# Patient Record
Sex: Female | Born: 1985
Health system: Southern US, Community
[De-identification: ages and names within clinical notes are randomized; demographics above are authoritative.]

## PROBLEM LIST (undated history)

## (undated) DIAGNOSIS — Z9889 Other specified postprocedural states: Secondary | ICD-10-CM

## (undated) DIAGNOSIS — R112 Nausea with vomiting, unspecified: Secondary | ICD-10-CM

## (undated) DIAGNOSIS — F988 Other specified behavioral and emotional disorders with onset usually occurring in childhood and adolescence: Secondary | ICD-10-CM

## (undated) DIAGNOSIS — F99 Mental disorder, not otherwise specified: Secondary | ICD-10-CM

## (undated) HISTORY — DX: Nausea with vomiting, unspecified: R11.2

## (undated) HISTORY — DX: Other specified behavioral and emotional disorders with onset usually occurring in childhood and adolescence: F98.8

## (undated) HISTORY — DX: Other specified postprocedural states: Z98.890

## (undated) HISTORY — PX: TONSILLECTOMY: SHX5217

## (undated) HISTORY — PX: WISDOM TOOTH EXTRACTION: SHX21

---

## 2009-05-31 ENCOUNTER — Ambulatory Visit: Payer: Self-pay | Admitting: Family Medicine

## 2009-05-31 DIAGNOSIS — F988 Other specified behavioral and emotional disorders with onset usually occurring in childhood and adolescence: Secondary | ICD-10-CM | POA: Insufficient documentation

## 2009-08-27 ENCOUNTER — Telehealth: Payer: Self-pay | Admitting: Family Medicine

## 2009-11-21 ENCOUNTER — Telehealth: Payer: Self-pay | Admitting: Family Medicine

## 2010-02-28 ENCOUNTER — Ambulatory Visit: Payer: Self-pay | Admitting: Family Medicine

## 2010-05-16 ENCOUNTER — Telehealth: Payer: Self-pay | Admitting: Family Medicine

## 2010-08-26 ENCOUNTER — Telehealth: Payer: Self-pay | Admitting: Family Medicine

## 2010-09-23 NOTE — Progress Notes (Signed)
Summary: REFILL REQ ON Adderall  Phone Note Call from Patient   Caller: Patient @ [phone] Call For: Evelena Peat MD Reason for Call: Refill Medication Summary of Call: Pt called to req a refill on med (Adderall Xr 30 Mg).... Pt adv that she can be reached at [phone] when same is ready for p/u.   Initial call taken by: Debbra Riding,  August 27, 2009 11:00 AM  Follow-up for Phone Call        will refill. Follow-up by: Evelena Peat MD,  August 28, 2009 12:46 PM  Additional Follow-up for Phone Call Additional follow up Details #1::        Pt informed Rx ready for pick-up on personally identified VM Additional Follow-up by: Sid Falcon LPN,  August 28, 2009 1:36 PM    Prescriptions: ADDERALL XR 30 MG XR24H-CAP (AMPHETAMINE-DEXTROAMPHETAMINE) one by mouth daily  #90 x 0   Entered and Authorized by:   Evelena Peat MD   Signed by:   Evelena Peat MD on 08/28/2009   Method used:   Print then Give to Patient   RxID:   [id number]

## 2010-09-23 NOTE — Progress Notes (Signed)
Summary: new Adderall Rx needed (dental appt 10/3)  Phone Note Call from Patient Call back at Home Phone [phone]   Caller: Patient Call For: Evelena Peat MD Summary of Call: pt needs new rx adderall xr 30 mg Initial call taken by: Heron Sabins,  May 16, 2010 12:21 PM  Follow-up for Phone Call        should be due Oct 8 Follow-up by: Evelena Peat MD,  May 16, 2010 2:03 PM  Additional Follow-up for Phone Call Additional follow up Details #1::        Per Dr Caryl Never, OK to provide refill RX when she is home from college on Monday, Oct 3rd for a dental appt.  Pt informed, she will call that day a reminder. Additional Follow-up by: Sid Falcon LPN,  May 16, 2010 5:41 PM    Additional Follow-up for Phone Call Additional follow up Details #2::    OK to fill then Evelena Peat MD  May 22, 2010 1:09 PM   Additional Follow-up for Phone Call Additional follow up Details #3:: Details for Additional Follow-up Action Taken: Printed Rx and called [facility], lft msg that rx is ready for pick-up  Additional Follow-up by: Kathrynn Speed CMA,  May 26, 2010 9:28 AM  Prescriptions: ADDERALL XR 30 MG XR24H-CAP (AMPHETAMINE-DEXTROAMPHETAMINE) one by mouth daily  #90 x 0   Entered by:   Kathrynn Speed CMA   Authorized by:   Evelena Peat MD   Signed by:   Kathrynn Speed CMA on 05/26/2010   Method used:   Print then Give to Patient   RxID:   [id number]

## 2010-09-23 NOTE — Assessment & Plan Note (Signed)
Summary: MEDICATION REVIEW // RS   Vital Signs:  Patient profile:   25 year old female Weight:      113 pounds Temp:     98.3 degrees F oral BP sitting:   94 / 68  (left arm) Cuff size:   regular  Vitals Entered By: Duard Brady LPN (February 28, 1609 9:04 AM) CC: medication refill Is Patient Diabetic? No   History of Present Illness: Patient here to followup ADD. She attends [address] and will be a senior this year. School is going well. No side effects from medication. Minimal reduction in appetite but she has gained 2 pounds over the past year. No headaches.  Preventive Screening-Counseling & Management  Alcohol-Tobacco     Smoking Status: never  Allergies: 1)  ! Septra Ds (Sulfamethoxazole-Trimethoprim)  Past History:  Past Medical History: Last updated: 05/31/2009 ADD  Review of Systems  The patient denies anorexia, fever, weight loss, and chest pain.    Physical Exam  General:  Well-developed,well-nourished,in no acute distress; alert,appropriate and cooperative throughout examination Head:  Normocephalic and atraumatic without obvious abnormalities. No apparent alopecia or balding. Eyes:  No corneal or conjunctival inflammation noted. EOMI. Perrla. Funduscopic exam benign, without hemorrhages, exudates or papilledema. Vision grossly normal. Mouth:  Oral mucosa and oropharynx without lesions or exudates.  Teeth in good repair. Neck:  No deformities, masses, or tenderness noted. Lungs:  Normal respiratory effort, chest expands symmetrically. Lungs are clear to auscultation, no crackles or wheezes. Heart:  Normal rate and regular rhythm. S1 and S2 normal without gallop, murmur, click, rub or other extra sounds.   Impression & Recommendations:  Problem # 1:  ADD (ICD-314.00)  Complete Medication List: 1)  Yaz 3-0.02 Mg Tabs (Drospirenone-ethinyl estradiol) .... One by mouth daily 2)  Adderall Xr 30 Mg Xr24h-cap  (Amphetamine-dextroamphetamine) .... One by mouth daily  Patient Instructions: 1)  Please schedule a follow-up appointment in 1 year.  Prescriptions: ADDERALL XR 30 MG XR24H-CAP (AMPHETAMINE-DEXTROAMPHETAMINE) one by mouth daily  #90 x 0   Entered and Authorized by:   Evelena Peat MD   Signed by:   Evelena Peat MD on 02/28/2010   Method used:   Print then Give to Patient   RxID:   [id number]

## 2010-09-23 NOTE — Progress Notes (Signed)
Summary: refill Adderall, last #90 refilled on 08/28/09  Phone Note Refill Request Call back at Home Phone [phone] Message from:  Patient---live call  Refills Requested: Medication #1:  ADDERALL XR 30 MG XR24H-CAP one by mouth daily. call pt when ready.  Initial call taken by: Warnell Forester,  November 21, 2009 3:25 PM  Follow-up for Phone Call        Last filled 08/28/2009 Sid Falcon LPN  November 21, 2009 4:22 PM   Additional Follow-up for Phone Call Additional follow up Details #1::        pt cb checking on status Additional Follow-up by: Heron Sabins,  November 25, 2009 1:36 PM    Additional Follow-up for Phone Call Additional follow up Details #2::    refilled Follow-up by: Evelena Peat MD,  November 26, 2009 8:28 AM  Additional Follow-up for Phone Call Additional follow up Details #3:: Details for Additional Follow-up Action Taken: Pt informed on personally identified VM rx ready for pick-up Additional Follow-up by: Sid Falcon LPN,  November 26, 2009 8:34 AM  Prescriptions: ADDERALL XR 30 MG XR24H-CAP (AMPHETAMINE-DEXTROAMPHETAMINE) one by mouth daily  #90 x 0   Entered and Authorized by:   Evelena Peat MD   Signed by:   Evelena Peat MD on 11/26/2009   Method used:   Print then Give to Patient   RxID:   [id number]

## 2010-09-25 NOTE — Progress Notes (Signed)
Summary: refill Adderall, last filled 05/26/2010  Phone Note Refill Request Call back at Home Phone [phone] Message from:  Patient---live call  Refills Requested: Medication #1:  ADDERALL XR 30 MG XR24H-CAP one by mouth daily.   Brand Name Necessary? No please call when ready....dad will pick up rx.  Initial call taken by: Warnell Forester,  August 26, 2010 8:29 AM  Follow-up for Phone Call        Last filled 05-26-2010 Sid Falcon LPN  August 26, 2010 1:40 PM refilled Follow-up by: Evelena Peat MD,  August 27, 2010 8:08 AM  Additional Follow-up for Phone Call Additional follow up Details #1::        Pt informed Additional Follow-up by: Sid Falcon LPN,  August 27, 2010 10:13 AM    Prescriptions: ADDERALL XR 30 MG XR24H-CAP (AMPHETAMINE-DEXTROAMPHETAMINE) one by mouth daily  #90 x 0   Entered and Authorized by:   Evelena Peat MD   Signed by:   Evelena Peat MD on 08/27/2010   Method used:   Print then Give to Patient   RxID:   [id number]

## 2010-11-26 ENCOUNTER — Telehealth: Payer: Self-pay | Admitting: Family Medicine

## 2010-11-26 NOTE — Telephone Encounter (Signed)
Ok to refill 

## 2010-11-26 NOTE — Telephone Encounter (Signed)
Pt needs new rx generic adderall 30 mg. Pt is out of med today.

## 2010-11-27 ENCOUNTER — Encounter: Payer: Self-pay | Admitting: Family Medicine

## 2010-11-27 MED ORDER — AMPHETAMINE-DEXTROAMPHET ER 30 MG PO CP24: 30.0000 mg | ORAL_CAPSULE | ORAL | Status: DC

## 2010-11-27 NOTE — Telephone Encounter (Signed)
Rx filled for 3 months, pt informed ready for pick-up

## 2011-02-26 ENCOUNTER — Telehealth: Payer: Self-pay | Admitting: *Deleted

## 2011-02-26 NOTE — Telephone Encounter (Signed)
Pt requesting cpx appt on 03/09/11 at 8am (lives in [place] and that is the only day she can come in). I advised pt this appt time is not available, offered her another time that day however, that will not work.  Pt states the only other day she can come in is tomorrow late afternoon.  Would like to be put in at 4:15 for cpx.  Please advise if ok.  Pt has not had any labs drawn.

## 2011-02-26 NOTE — Telephone Encounter (Signed)
This is OK

## 2011-02-27 ENCOUNTER — Ambulatory Visit (INDEPENDENT_AMBULATORY_CARE_PROVIDER_SITE_OTHER): Payer: BC Managed Care – PPO | Admitting: Family Medicine

## 2011-02-27 ENCOUNTER — Encounter: Payer: Self-pay | Admitting: Family Medicine

## 2011-02-27 VITALS — BP 110/78 | HR 80 | Temp 98.6°F | Resp 12 | Ht 66.0 in | Wt 109.0 lb

## 2011-02-27 DIAGNOSIS — Z0000 Encounter for general adult medical examination without abnormal findings: Secondary | ICD-10-CM

## 2011-02-27 MED ORDER — AMPHETAMINE-DEXTROAMPHET ER 30 MG PO CP24: 30.0000 mg | ORAL_CAPSULE | ORAL | Status: DC

## 2011-02-27 NOTE — Progress Notes (Signed)
  Subjective:    Patient ID: Linda Holden, female    DOB: August 24, 1986, 25 y.o.   MRN: [id number]  HPI Patient seen for her wellness visit. She sees gynecologist regularly for Pap smears. She has history of attention deficit disorder on Adderall 30 mg daily. Also takes birth control pills. Immunizations up to date. She recalls hemoglobin through gynecologist which was normal. Patient does not do any regular exercise. No consistent calcium or vitamin D use. Probable low dietary consumption of calcium  No regular consumption of alcohol and nonsmoker  Past Medical History  Diagnosis Date  . ADD 05/31/2009   Past Surgical History  Procedure Date  . Tonsillectomy     reports that she has never smoked. She does not have any smokeless tobacco history on file. Her alcohol and drug histories not on file. family history is not on file. Allergies  Allergen Reactions  . Sulfamethoxazole W/Trimethoprim     Mother allergic severe, was told to not let her children have sulfa     Review of Systems  Constitutional: Negative for fever, activity change, appetite change and fatigue.  HENT: Negative for hearing loss, ear pain, sore throat and trouble swallowing.   Eyes: Negative for visual disturbance.  Respiratory: Negative for cough and shortness of breath.   Cardiovascular: Negative for chest pain and palpitations.  Gastrointestinal: Negative for abdominal pain, diarrhea, constipation and blood in stool.  Genitourinary: Negative for dysuria and hematuria.  Musculoskeletal: Negative for myalgias, back pain and arthralgias.  Skin: Negative for rash.  Neurological: Negative for dizziness, syncope and headaches.  Hematological: Negative for adenopathy.  Psychiatric/Behavioral: Negative for confusion and dysphoric mood.       Objective:   Physical Exam  Constitutional: She is oriented to person, place, and time. She appears well-developed and well-nourished.  HENT:  Head: Normocephalic and  atraumatic.  Eyes: EOM are normal. Pupils are equal, round, and reactive to light.  Neck: Normal range of motion. Neck supple. No thyromegaly present.  Cardiovascular: Normal rate, regular rhythm and normal heart sounds.   No murmur heard. Pulmonary/Chest: Breath sounds normal. No respiratory distress. She has no wheezes. She has no rales.  Abdominal: Soft. Bowel sounds are normal. She exhibits no distension and no mass. There is no tenderness. There is no rebound and no guarding.  Musculoskeletal: Normal range of motion. She exhibits no edema.  Lymphadenopathy:    She has no cervical adenopathy.  Neurological: She is alert and oriented to person, place, and time. She displays normal reflexes. No cranial nerve deficit.  Skin: No rash noted.  Psychiatric: She has a normal mood and affect. Her behavior is normal. Judgment and thought content normal.          Assessment & Plan:  Healthy 25 year old female. Suggested some regular strength training and aerobic exercise. Suggestions given for healthy weight gain. Continue GYN followup. Adderall refilled. No labs since hemoglobin reportedly normal recently and no indication for additional labs

## 2011-06-08 ENCOUNTER — Other Ambulatory Visit: Payer: Self-pay | Admitting: Family Medicine

## 2011-06-08 NOTE — Telephone Encounter (Signed)
adderall last filled in 02-27-11 X 3

## 2011-06-08 NOTE — Telephone Encounter (Signed)
OK to refill

## 2011-06-08 NOTE — Telephone Encounter (Signed)
Pt called req refill of amphetamine-dextroamphetamine (ADDERALL XR) 30 MG 24 hr capsule x 3 scripts. Pt req to have her father pick up script on Tuesday afternoon or on Wednesday a.m this wk.

## 2011-06-09 MED ORDER — AMPHETAMINE-DEXTROAMPHET ER 30 MG PO CP24: 30.0000 mg | ORAL_CAPSULE | ORAL | Status: DC

## 2011-06-09 NOTE — Telephone Encounter (Signed)
Pt is aware waiting on MD sig

## 2011-06-09 NOTE — Telephone Encounter (Signed)
Pt aware Rx will be ready tomorrow

## 2011-09-14 ENCOUNTER — Telehealth: Payer: Self-pay | Admitting: Family Medicine

## 2011-09-14 MED ORDER — AMPHETAMINE-DEXTROAMPHET ER 30 MG PO CP24: 30.0000 mg | ORAL_CAPSULE | ORAL | Status: DC

## 2011-09-14 NOTE — Telephone Encounter (Signed)
Message left on VM at home Rx ready to pick-up

## 2011-09-14 NOTE — Telephone Encounter (Signed)
OK to refill

## 2011-09-14 NOTE — Telephone Encounter (Signed)
Pt need new rx generic adderall xr 30 mg. Pt is out of med. Pt father Heron Nay will pick up rx.

## 2011-09-15 ENCOUNTER — Other Ambulatory Visit: Payer: Self-pay | Admitting: *Deleted

## 2011-09-15 MED ORDER — AMPHETAMINE-DEXTROAMPHET ER 30 MG PO CP24: 30.0000 mg | ORAL_CAPSULE | ORAL | Status: DC

## 2011-09-15 NOTE — Telephone Encounter (Signed)
OK per Dr Caryl Never, redo med RX

## 2011-12-10 ENCOUNTER — Telehealth: Payer: Self-pay | Admitting: Family Medicine

## 2011-12-10 NOTE — Telephone Encounter (Signed)
Pt called req to get 90 day scripts for amphetamine-dextroamphetamine (ADDERALL XR, 30MG ,) 30 MG 24 hr capsule

## 2011-12-10 NOTE — Telephone Encounter (Signed)
Last filled 09-15-11, pt requesting #90 with 0 refills Pt had CPX last July

## 2011-12-11 MED ORDER — AMPHETAMINE-DEXTROAMPHET ER 30 MG PO CP24: 30.0000 mg | ORAL_CAPSULE | ORAL | Status: DC

## 2011-12-11 NOTE — Telephone Encounter (Signed)
Refill #90 and needs f/u by July.

## 2011-12-11 NOTE — Telephone Encounter (Signed)
Rx ready for pick up, pt informed.

## 2012-02-06 ENCOUNTER — Emergency Department (HOSPITAL_COMMUNITY)
Admission: EM | Admit: 2012-02-06 | Discharge: 2012-02-06 | Disposition: A | Discharge status: Home / Self Care | Payer: BC Managed Care – PPO | Source: Home / Self Care

## 2012-09-16 ENCOUNTER — Encounter: Payer: Self-pay | Admitting: Family Medicine

## 2012-09-16 ENCOUNTER — Ambulatory Visit (INDEPENDENT_AMBULATORY_CARE_PROVIDER_SITE_OTHER): Payer: BC Managed Care – PPO | Admitting: Family Medicine

## 2012-09-16 VITALS — BP 110/70 | Wt 115.0 lb

## 2012-09-16 DIAGNOSIS — F988 Other specified behavioral and emotional disorders with onset usually occurring in childhood and adolescence: Secondary | ICD-10-CM

## 2012-09-16 MED ORDER — AMPHETAMINE-DEXTROAMPHET ER 30 MG PO CP24: 30.0000 mg | ORAL_CAPSULE | ORAL | Status: DC

## 2012-09-16 NOTE — Progress Notes (Signed)
  Subjective:    Patient ID: Linda Holden, female    DOB: 08-03-1986, 27 y.o.   MRN: [id number]  HPI Followup ADD. Patient had been seeing a physician in [place] but plans to transfer back here. She is having difficulty getting appointments there. She remains on Adderall XR 30 mg once daily. No headaches. Good appetite. 6 pound weight gain since last visit. Generally feels well. In consistent exercise. Medication seems to be working well. No difficulties with focusing   Review of Systems  Constitutional: Negative for appetite change and unexpected weight change.  Respiratory: Negative for shortness of breath.   Cardiovascular: Negative for chest pain.  Neurological: Negative for headaches.       Objective:   Physical Exam  Constitutional: She appears well-developed and well-nourished.  Neck: Neck supple. No thyromegaly present.  Cardiovascular: Normal rate and regular rhythm.  Exam reveals no gallop.   No murmur heard. Pulmonary/Chest: Effort normal and breath sounds normal. No respiratory distress. She has no wheezes. She has no rales.  Musculoskeletal: She exhibits no edema.          Assessment & Plan:  ADD. Stable. Refill medication for 3 months. We emphasized the importance of having one primary physician in charge of controlled medications such as this and she agrees. She'll send for old records

## 2012-12-13 ENCOUNTER — Telehealth: Payer: Self-pay | Admitting: Family Medicine

## 2012-12-13 MED ORDER — AMPHETAMINE-DEXTROAMPHET ER 30 MG PO CP24: 30.0000 mg | ORAL_CAPSULE | ORAL | Status: DC

## 2012-12-13 NOTE — Telephone Encounter (Signed)
Patient called stating that she need a refill of her adderall 30 mg 1poqd. Please assist.  °

## 2012-12-13 NOTE — Telephone Encounter (Signed)
Refill OK

## 2012-12-13 NOTE — Telephone Encounter (Signed)
Pt informed Rx ready to pick up

## 2012-12-13 NOTE — Telephone Encounter (Signed)
Adderall XR 30 last filled 09-16-12, #90 with 0 refills

## 2013-03-13 ENCOUNTER — Telehealth: Payer: Self-pay | Admitting: Family Medicine

## 2013-03-13 MED ORDER — AMPHETAMINE-DEXTROAMPHET ER 30 MG PO CP24: 30.0000 mg | ORAL_CAPSULE | ORAL | Status: DC

## 2013-03-13 NOTE — Telephone Encounter (Signed)
Last refill  12/13/12 #90 no refills

## 2013-03-13 NOTE — Telephone Encounter (Signed)
Refill OK

## 2013-03-13 NOTE — Telephone Encounter (Signed)
Pt calling to request rx refill of amphetamine-dextroamphetamine (ADDERALL XR) 30 MG 24 hr capsule.  Please call when available for pick up.

## 2013-03-13 NOTE — Telephone Encounter (Signed)
medication at the front for pick up

## 2013-06-08 ENCOUNTER — Telehealth: Payer: Self-pay | Admitting: Family Medicine

## 2013-06-08 MED ORDER — AMPHETAMINE-DEXTROAMPHET ER 30 MG PO CP24: 30.0000 mg | ORAL_CAPSULE | ORAL | Status: DC

## 2013-06-08 NOTE — Telephone Encounter (Signed)
Last visit 09/16/12 Last refill 11/27/10 #30 2 refill

## 2013-06-08 NOTE — Telephone Encounter (Signed)
Refill for 3 months. 

## 2013-06-08 NOTE — Telephone Encounter (Signed)
Pt is calling to request a 3 month supply of her amphetamine-dextroamphetamine (ADDERALL XR) 30 MG 24 hr capsule. Please assist.  °

## 2013-06-08 NOTE — Telephone Encounter (Signed)
Pt is aware that RX is ready for pick up. 

## 2013-08-18 ENCOUNTER — Encounter: Payer: Self-pay | Admitting: Family Medicine

## 2013-08-18 ENCOUNTER — Ambulatory Visit (INDEPENDENT_AMBULATORY_CARE_PROVIDER_SITE_OTHER): Payer: 59 | Admitting: Family Medicine

## 2013-08-18 VITALS — BP 100/60 | HR 60 | Temp 97.9°F | Wt 116.0 lb

## 2013-08-18 DIAGNOSIS — Z79899 Other long term (current) drug therapy: Secondary | ICD-10-CM

## 2013-08-18 DIAGNOSIS — F988 Other specified behavioral and emotional disorders with onset usually occurring in childhood and adolescence: Secondary | ICD-10-CM

## 2013-08-18 LAB — HEPATIC FUNCTION PANEL
Albumin: 4.3 g/dL (ref 3.5–5.2)
Bilirubin, Direct: 0.1 mg/dL (ref 0.0–0.3)
Total Protein: 6.6 g/dL (ref 6.0–8.3)

## 2013-08-18 MED ORDER — TERBINAFINE HCL 250 MG PO TABS: 250.0000 mg | ORAL_TABLET | Freq: Every day | ORAL | Status: DC

## 2013-08-18 NOTE — Patient Instructions (Signed)

## 2013-08-18 NOTE — Progress Notes (Signed)
Pre visit review using our clinic review tool, if applicable. No additional management support is needed unless otherwise documented below in the visit note. 

## 2013-08-18 NOTE — Progress Notes (Signed)
   Subjective:    Patient ID: Linda Holden, female    DOB: 1985/11/21, 27 y.o.   MRN: [id number]  HPI Patient seen for the following issues  Attention deficit disorder. She takes Adderall XR 30 mg once daily. Weight is been stable. Medications working well. No side effects. She has no specific complaints regarding this. Does not need refills of this time.  She has new issue of dysmorphic left great toenail. She describes the nail trauma several months ago. Since then she's had brittle nail changes and fungal changes. No other nails are involved. She is requesting treatment. No associated pain. No history of diabetes or circulatory problems.  Past Medical History  Diagnosis Date  . ADD 05/31/2009   Past Surgical History  Procedure Laterality Date  . Tonsillectomy      reports that she has never smoked. She does not have any smokeless tobacco history on file. Her alcohol and drug histories are not on file. family history is not on file. Allergies  Allergen Reactions  . Sulfamethoxazole-Trimethoprim     Mother allergic severe, was told to not let her children have sulfa      Review of Systems  Constitutional: Negative for appetite change, fatigue and unexpected weight change.  Eyes: Negative for visual disturbance.  Respiratory: Negative for cough, chest tightness, shortness of breath and wheezing.   Cardiovascular: Negative for chest pain, palpitations and leg swelling.  Neurological: Negative for dizziness, seizures, syncope, weakness, light-headedness and headaches.       Objective:   Physical Exam  Constitutional: She appears well-developed and well-nourished.  Neck: Neck supple. No thyromegaly present.  Cardiovascular: Normal rate.   No murmur heard. Pulmonary/Chest: Effort normal and breath sounds normal. No respiratory distress. She has no wheezes. She has no rales.  Musculoskeletal: She exhibits no edema.  Skin:  Patient has dysmorphic left great toe nail with  brittle changes.          Assessment & Plan:   #1 attention deficit disorder. Stable. Continue Adderall XR 30 mg once daily #2 probable onychomycosis left great toenail. Check hepatic panel. If normal, consider Lamisil 250 mg once daily for 3 months

## 2013-09-08 ENCOUNTER — Telehealth: Payer: Self-pay | Admitting: Family Medicine

## 2013-09-08 MED ORDER — AMPHETAMINE-DEXTROAMPHET ER 30 MG PO CP24: 30.0000 mg | ORAL_CAPSULE | ORAL | Status: DC

## 2013-09-08 NOTE — Telephone Encounter (Signed)
Last visit 08/18/13 Last refill 06/08/13 #30 2 refills

## 2013-09-08 NOTE — Telephone Encounter (Signed)
Refill OK

## 2013-09-08 NOTE — Telephone Encounter (Signed)
Pt aware that RX is ready for pick up  

## 2013-09-08 NOTE — Telephone Encounter (Signed)
Pt needs new rx generic adderall xr 30 mg °

## 2013-12-08 ENCOUNTER — Telehealth: Payer: Self-pay | Admitting: Family Medicine

## 2013-12-08 MED ORDER — AMPHETAMINE-DEXTROAMPHET ER 30 MG PO CP24: 30.0000 mg | ORAL_CAPSULE | ORAL | Status: DC

## 2013-12-08 NOTE — Telephone Encounter (Signed)
Refill OK

## 2013-12-08 NOTE — Telephone Encounter (Signed)
Pt informed and aware that RX is ready for pick up.

## 2013-12-08 NOTE — Telephone Encounter (Signed)
Pt request 3 mo refill of amphetamine-dextroamphetamine (ADDERALL XR) 30 MG 24 hr capsule

## 2013-12-08 NOTE — Telephone Encounter (Signed)
Last visit 08/18/13 Last refill 1-16/2015 #30 2 refills

## 2014-03-09 ENCOUNTER — Telehealth: Payer: Self-pay | Admitting: Family Medicine

## 2014-03-09 MED ORDER — AMPHETAMINE-DEXTROAMPHET ER 30 MG PO CP24: 30.0000 mg | ORAL_CAPSULE | ORAL | Status: DC

## 2014-03-09 NOTE — Telephone Encounter (Signed)
Left message on pt VM that RX is ready for pick up 

## 2014-03-09 NOTE — Telephone Encounter (Signed)
Last visit 08/18/13 Last refill 12/08/13 #30 2 refill

## 2014-03-09 NOTE — Telephone Encounter (Signed)
Refill OK

## 2014-03-09 NOTE — Telephone Encounter (Signed)
Pt req rx on amphetamine-dextroamphetamine (ADDERALL XR) 30 MG 24 hr capsule ° °

## 2014-04-09 ENCOUNTER — Telehealth: Payer: Self-pay | Admitting: Family Medicine

## 2014-04-09 NOTE — Telephone Encounter (Signed)
Last visit 08/18/13 Last refill 03/09/14 #30 2 refill

## 2014-04-09 NOTE — Telephone Encounter (Signed)
Pt needs new rx generic adderall xr 30 mg °

## 2014-04-09 NOTE — Telephone Encounter (Signed)
Refill times 3 then needs office follow up.

## 2014-04-10 MED ORDER — AMPHETAMINE-DEXTROAMPHET ER 30 MG PO CP24: 30.0000 mg | ORAL_CAPSULE | ORAL | Status: DC

## 2014-04-10 NOTE — Telephone Encounter (Signed)
Left detailed message on Vm. Rx is ready for pick up and needs to make follow up appt.

## 2014-05-22 ENCOUNTER — Encounter: Payer: Self-pay | Admitting: Physician Assistant

## 2014-05-22 ENCOUNTER — Ambulatory Visit (INDEPENDENT_AMBULATORY_CARE_PROVIDER_SITE_OTHER): Payer: Self-pay | Admitting: Physician Assistant

## 2014-05-22 VITALS — BP 120/80 | HR 72 | Temp 98.6°F | Resp 18 | Wt 122.3 lb

## 2014-05-22 DIAGNOSIS — N390 Urinary tract infection, site not specified: Secondary | ICD-10-CM

## 2014-05-22 DIAGNOSIS — R300 Dysuria: Secondary | ICD-10-CM

## 2014-05-22 LAB — POCT URINALYSIS DIPSTICK
Bilirubin, UA: NEGATIVE
Ketones, UA: NEGATIVE
Nitrite, UA: POSITIVE
Spec Grav, UA: <=1.005
Urobilinogen, UA: 1
pH, UA: 6

## 2014-05-22 MED ORDER — NITROFURANTOIN MONOHYD MACRO 100 MG PO CAPS: 100.0000 mg | ORAL_CAPSULE | Freq: Two times a day (BID) | ORAL | Status: DC

## 2014-05-22 NOTE — Progress Notes (Signed)
Pre visit review using our clinic review tool, if applicable. No additional management support is needed unless otherwise documented below in the visit note. 

## 2014-05-22 NOTE — Progress Notes (Signed)
Subjective:    Patient ID: Linda Holden, female    DOB: 01/08/1986, 28 y.o.   MRN: [id number]  Dysuria  This is a new problem. The current episode started in the past 7 days (saturday). The problem occurs every urination. The problem has been unchanged. The quality of the pain is described as burning. The pain is at a severity of 7/10. There is no history of pyelonephritis. Associated symptoms include frequency and urgency. Pertinent negatives include no chills, discharge, flank pain, hematuria, hesitancy, nausea, possible pregnancy, sweats or vomiting. She has tried increased fluids (tried Azo) for the symptoms. The treatment provided mild relief. There is no history of catheterization, kidney stones, recurrent UTIs, a single kidney, urinary stasis or a urological procedure.      Review of Systems  Constitutional: Negative for fever and chills.  Respiratory: Negative for shortness of breath.   Cardiovascular: Negative for chest pain.  Gastrointestinal: Negative for nausea, vomiting and diarrhea.  Genitourinary: Positive for dysuria, urgency and frequency. Negative for hesitancy, hematuria and flank pain.  Neurological: Negative for syncope and headaches.  All other systems reviewed and are negative.   Past Medical History  Diagnosis Date  . ADD 05/31/2009    History   Social History  . Marital Status: Single    Spouse Name: N/A    Number of Children: N/A  . Years of Education: N/A   Occupational History  . Not on file.   Social History Main Topics  . Smoking status: Never Smoker   . Smokeless tobacco: Not on file  . Alcohol Use: Not on file  . Drug Use: Not on file  . Sexual Activity: Not on file   Other Topics Concern  . Not on file   Social History Narrative  . No narrative on file    Past Surgical History  Procedure Laterality Date  . Tonsillectomy      No family history on file.  Allergies  Allergen Reactions  . Sulfamethoxazole-Trimethoprim    Mother allergic severe, was told to not let her children have sulfa    Current Outpatient Prescriptions on File Prior to Visit  Medication Sig Dispense Refill  . amphetamine-dextroamphetamine (ADDERALL XR) 30 MG 24 hr capsule Take 1 capsule (30 mg total) by mouth every morning.  90 capsule  0  . amphetamine-dextroamphetamine (ADDERALL XR) 30 MG 24 hr capsule Take 1 capsule (30 mg total) by mouth every morning.  30 capsule  0  . amphetamine-dextroamphetamine (ADDERALL XR) 30 MG 24 hr capsule Take 1 capsule (30 mg total) by mouth every morning.  30 capsule  0  . amphetamine-dextroamphetamine (ADDERALL XR) 30 MG 24 hr capsule Take 1 capsule (30 mg total) by mouth every morning.  30 capsule  0  . norethindrone-ethinyl estradiol (JUNEL FE,GILDESS FE,LOESTRIN FE) 1-20 MG-MCG tablet Take 1 tablet by mouth daily.      Marland Kitchen. terbinafine (LAMISIL) 250 MG tablet Take 1 tablet (250 mg total) by mouth daily.  90 tablet  0   No current facility-administered medications on file prior to visit.    EXAM: BP 120/80  Pulse 72  Temp(Src) 98.6 F (37 C) (Oral)  Resp 18  Wt 122 lb 4.8 oz (55.475 kg)  LMP 05/08/2014     Objective:   Physical Exam  Nursing note and vitals reviewed. Constitutional: She is oriented to person, place, and time. She appears well-developed and well-nourished. No distress.  HENT:  Head: Normocephalic and atraumatic.  Eyes: Conjunctivae and EOM are normal.  Pupils are equal, round, and reactive to light.  Cardiovascular: Normal rate, regular rhythm and intact distal pulses.   Pulmonary/Chest: Effort normal and breath sounds normal. No respiratory distress. She exhibits no tenderness.  No CVA ttp.  Neurological: She is alert and oriented to person, place, and time.  Skin: Skin is warm and dry. No rash noted. She is not diaphoretic. No erythema. No pallor.  Psychiatric: She has a normal mood and affect. Her behavior is normal. Judgment and thought content normal.     Lab Results    Component Value Date   ALT 10 08/18/2013   AST 16 08/18/2013        Assessment & Plan:  Jennier was seen today for dysuria.  Diagnoses and associated orders for this visit:  Dysuria Comments: UA shows blood and leuks. Obtain Culture. - POCT urinalysis dipstick; Standing - POCT urinalysis dipstick - nitrofurantoin, macrocrystal-monohydrate, (MACROBID) 100 MG capsule; Take 1 capsule (100 mg total) by mouth 2 (two) times daily. - Culture, Urine  Urinary tract infection, site not specified Comments: Treat with Macrobid pending culture results. Push fluid hydration, watchful waiting.     Return precautions provided, and patient handout on UTI.  Plan to follow up as needed, or for worsening or persistent symptoms despite treatment.  Patient Instructions  Macrobid twice daily for 7 days for UTI.  We will call with you culture results when available.  Continue to push fluid hydration with water.  If emergency symptoms discussed during visit developed, seek medical attention immediately.  Followup as needed, or for worsening or persistent symptoms despite treatment.

## 2014-05-22 NOTE — Patient Instructions (Addendum)
Macrobid twice daily for 7 days for UTI.  We will call with you culture results when available.  Continue to push fluid hydration with water.  If emergency symptoms discussed during visit developed, seek medical attention immediately.  Followup as needed, or for worsening or persistent symptoms despite treatment.    Urinary Tract Infection A urinary tract infection (UTI) can occur any place along the urinary tract. The tract includes the kidneys, ureters, bladder, and urethra. A type of germ called bacteria often causes a UTI. UTIs are often helped with antibiotic medicine.  HOME CARE   If given, take antibiotics as told by your doctor. Finish them even if you start to feel better.  Drink enough fluids to keep your pee (urine) clear or pale yellow.  Avoid tea, drinks with caffeine, and bubbly (carbonated) drinks.  Pee often. Avoid holding your pee in for a long time.  Pee before and after having sex (intercourse).  Wipe from front to back after you poop (bowel movement) if you are a woman. Use each tissue only once. GET HELP RIGHT AWAY IF:   You have back pain.  You have lower belly (abdominal) pain.  You have chills.  You feel sick to your stomach (nauseous).  You throw up (vomit).  Your burning or discomfort with peeing does not go away.  You have a fever.  Your symptoms are not better in 3 days. MAKE SURE YOU:   Understand these instructions.  Will watch your condition.  Will get help right away if you are not doing well or get worse. Document Released: 01/27/2008 Document Revised: 05/04/2012 Document Reviewed: 03/10/2012 [facility] Patient Information 2015 [employer], [place]. This information is not intended to replace advice given to you by your health care provider. Make sure you discuss any questions you have with your health care provider.

## 2014-05-25 LAB — URINE CULTURE

## 2014-07-06 ENCOUNTER — Ambulatory Visit (INDEPENDENT_AMBULATORY_CARE_PROVIDER_SITE_OTHER): Payer: BC Managed Care – PPO | Admitting: Family Medicine

## 2014-07-06 ENCOUNTER — Encounter: Payer: Self-pay | Admitting: Family Medicine

## 2014-07-06 VITALS — BP 110/70 | HR 78 | Temp 98.2°F | Wt 120.0 lb

## 2014-07-06 DIAGNOSIS — B360 Pityriasis versicolor: Secondary | ICD-10-CM

## 2014-07-06 DIAGNOSIS — B078 Other viral warts: Secondary | ICD-10-CM

## 2014-07-06 MED ORDER — KETOCONAZOLE 200 MG PO TABS: ORAL_TABLET | ORAL | Status: DC

## 2014-07-06 NOTE — Progress Notes (Signed)
Pre visit review using our clinic review tool, if applicable. No additional management support is needed unless otherwise documented below in the visit note. 

## 2014-07-06 NOTE — Progress Notes (Signed)
   Subjective:    Patient ID: Linda Holden, female    DOB: 08/04/1986, 28 y.o.   MRN: [id number]  HPI Patient seen for the following issues  Common wart right hand. She had one several years ago on one of her fingers treated with liquid nitrogen she will like the same today. This has been present for few months  Skin rash on her back. First noted last summer. More prominent with increased tanning. She has splotchy areas of hypopigmentation. No anterior trunk involvement no extremity involvement.  Past Medical History  Diagnosis Date  . ADD 05/31/2009   Past Surgical History  Procedure Laterality Date  . Tonsillectomy      reports that she has never smoked. She does not have any smokeless tobacco history on file. Her alcohol and drug histories are not on file. family history is not on file. Allergies  Allergen Reactions  . Sulfamethoxazole-Trimethoprim     Mother allergic severe, was told to not let her children have sulfa      Review of Systems  Skin: Positive for rash.       Objective:   Physical Exam  Constitutional: She appears well-developed and well-nourished.  Cardiovascular: Normal rate and regular rhythm.   Pulmonary/Chest: Effort normal.  Skin: Rash noted.  Right palm reveals proximally 5 mm common wart  Patient has splotchy hypopigmented rash on her back. No significant scaling.          Assessment & Plan:  #1 common wart right hand. Treated with liquid nitrogen. She is aware these have a high rate of recurrence #2 Tinea versicolor. Ketoconazole 200 mg 2 tablets 1 dose. Be in touch if this is not resolving over the next couple weeks. She is aware this tends to recur

## 2014-07-06 NOTE — Patient Instructions (Signed)
Tinea Versicolor Tinea versicolor is a common yeast infection of the skin. This condition becomes known when the yeast on our skin starts to overgrow (yeast is a normal inhabitant on our skin). This condition is noticed as white or light brown patches on brown skin, and is more evident in the summer on tanned skin. These areas are slightly scaly if scratched. The light patches from the yeast become evident when the yeast creates "holes in your suntan". This is most often noticed in the summer. The patches are usually located on the chest, back, pubis, neck and body folds. However, it may occur on any area of body. Mild itching and inflammation (redness or soreness) may be present. DIAGNOSIS  The diagnosisof this is made clinically (by looking). Cultures from samples are usually not needed. Examination under the microscope may help. However, yeast is normally found on skin. The diagnosis still remains clinical. Examination under Wood's Ultraviolet Light can determine the extent of the infection. TREATMENT  This common infection is usually only of cosmetic (only a concern to your appearance). It is easily treated with dandruff shampoo used during showers or bathing. Vigorous scrubbing will eliminate the yeast over several days time. The light areas in your skin may remain for weeks or months after the infection is cured unless your skin is exposed to sunlight. The lighter or darker spots caused by the fungus that remain after complete treatment are not a sign of treatment failure; it will take a long time to resolve. Your caregiver may recommend a number of commercial preparations or medication by mouth if home care is not working. Recurrence is common and preventative medication may be necessary. This skin condition is not highly contagious. Special care is not needed to protect close friends and family members. Normal hygiene is usually enough. Follow up is required only if you develop complications (such as a  secondary infection from scratching), if recommended by your caregiver, or if no relief is obtained from the preparations used. Document Released: 08/07/2000 Document Revised: 11/02/2011 Document Reviewed: 09/19/2008 ExitCare Patient Information 2015 Exit[employer], LLC. This information is not intended to replace advice given to you by your health care provider. Make sure you discuss any questions you have with your health care provider.  

## 2014-07-10 ENCOUNTER — Telehealth: Payer: Self-pay | Admitting: Family Medicine

## 2014-07-10 NOTE — Telephone Encounter (Signed)
OK to refill

## 2014-07-10 NOTE — Telephone Encounter (Signed)
Pt request refill of the following: amphetamine-dextroamphetamine (ADDERALL XR) 30 MG 24 hr capsule   Phamacy:  PICK UP

## 2014-07-10 NOTE — Telephone Encounter (Signed)
Last visit 07/06/14 Last refill 04/10/14 #30 2 refills

## 2014-07-11 MED ORDER — AMPHETAMINE-DEXTROAMPHET ER 30 MG PO CP24: 30.0000 mg | ORAL_CAPSULE | ORAL | Status: DC

## 2014-07-11 NOTE — Telephone Encounter (Signed)
Pt aware that RX is ready for pick up  

## 2014-10-08 ENCOUNTER — Telehealth: Payer: Self-pay | Admitting: Family Medicine

## 2014-10-08 MED ORDER — AMPHETAMINE-DEXTROAMPHET ER 30 MG PO CP24: 30.0000 mg | ORAL_CAPSULE | ORAL | Status: DC

## 2014-10-08 NOTE — Telephone Encounter (Signed)
Pt request refill amphetamine-dextroamphetamine (ADDERALL XR) 30 MG 24 hr capsule °3 mo supply °

## 2014-10-08 NOTE — Telephone Encounter (Signed)
Last visit 07/06/14 Last refill 07/11/14 #30 2 refill

## 2014-10-08 NOTE — Telephone Encounter (Signed)
Pt aware that Rx is ready for pick up 

## 2014-10-08 NOTE — Telephone Encounter (Signed)
Refills OK. 

## 2015-01-07 ENCOUNTER — Telehealth: Payer: Self-pay | Admitting: Family Medicine

## 2015-01-07 MED ORDER — AMPHETAMINE-DEXTROAMPHET ER 30 MG PO CP24: 30.0000 mg | ORAL_CAPSULE | ORAL | Status: DC

## 2015-01-07 NOTE — Telephone Encounter (Signed)
Needs a refill on her addrall 30mg  please. Call when ready for pick up.

## 2015-01-07 NOTE — Telephone Encounter (Signed)
Printed

## 2015-01-07 NOTE — Telephone Encounter (Signed)
Left message to advise pt Rx ready for pick up 

## 2015-01-07 NOTE — Telephone Encounter (Signed)
Refills OK. 

## 2015-01-07 NOTE — Telephone Encounter (Signed)
Ok to fill? Last ADD follow up 08/18/13

## 2015-04-05 ENCOUNTER — Telehealth: Payer: Self-pay | Admitting: Family Medicine

## 2015-04-05 MED ORDER — AMPHETAMINE-DEXTROAMPHET ER 30 MG PO CP24: 30.0000 mg | ORAL_CAPSULE | ORAL | Status: DC

## 2015-04-05 NOTE — Telephone Encounter (Signed)
Refill okay. Needs follow-up by November 

## 2015-04-05 NOTE — Telephone Encounter (Signed)
Pt aware that RX are ready for pickup

## 2015-04-05 NOTE — Telephone Encounter (Signed)
Pt needs new rx generic adderall xr 30 mg °

## 2015-04-05 NOTE — Telephone Encounter (Signed)
Last visit 07/06/14 Last refill 01/07/15 #30 2 refill

## 2015-04-22 ENCOUNTER — Encounter: Payer: Self-pay | Admitting: Family Medicine

## 2015-04-22 ENCOUNTER — Ambulatory Visit (INDEPENDENT_AMBULATORY_CARE_PROVIDER_SITE_OTHER): Payer: 59 | Admitting: Family Medicine

## 2015-04-22 VITALS — BP 102/64 | HR 77 | Temp 98.0°F | Wt 115.0 lb

## 2015-04-22 DIAGNOSIS — M2660 Temporomandibular joint disorder, unspecified: Secondary | ICD-10-CM

## 2015-04-22 DIAGNOSIS — M26609 Unspecified temporomandibular joint disorder, unspecified side: Secondary | ICD-10-CM

## 2015-04-22 NOTE — Progress Notes (Signed)
   Subjective:    Patient ID: Linda Holden, female    DOB: 27-Nov-1985, 27 y.o.   MRN: [id number]  HPI Acute visit. Patient developed some left ear pain couple weeks ago. She now has noted pain centered around her TMJ joint. No injury. No sore throat. No fevers or chills. No dizziness. Pain is worse with chewing food. She has pain with opening the mouth on the left side. No prior history of TMJ issues. She's not aware of any major malalignment issues. She had braces many years ago  Past Medical History  Diagnosis Date  . ADD 05/31/2009   Past Surgical History  Procedure Laterality Date  . Tonsillectomy      reports that she has never smoked. She does not have any smokeless tobacco history on file. Her alcohol and drug histories are not on file. family history is not on file. Allergies  Allergen Reactions  . Sulfamethoxazole-Trimethoprim     Mother allergic severe, was told to not let her children have sulfa      Review of Systems  Constitutional: Negative for fever and chills.  HENT: Negative for congestion, ear discharge, sore throat and trouble swallowing.   Skin: Negative for rash.  Hematological: Negative for adenopathy.       Objective:   Physical Exam  Constitutional: She appears well-developed and well-nourished.  HENT:  Right Ear: External ear normal.  Left Ear: External ear normal.  Mouth/Throat: Oropharynx is clear and moist.  She has tenderness over the left TMJ joint. No visible swelling or bruising. No warmth. She does have some malalignment with opening and closing the jaw at the TMJ joint  Neck: Neck supple. No thyromegaly present.  Cardiovascular: Normal rate and regular rhythm.   Pulmonary/Chest: Effort normal and breath sounds normal. No respiratory distress. She has no wheezes. She has no rales.  Lymphadenopathy:    She has no cervical adenopathy.          Assessment & Plan:  Left TMJ pain. Question cartilage subluxation. She will try icing.  Avoid difficult to chew foods. Try over-the-counter Advil or Aleve for symptom relief. Consider referral to oral maxillofacial surgeon if not improving over the next couple of weeks

## 2015-04-22 NOTE — Patient Instructions (Signed)
Temporomandibular Problems  Temporomandibular joint (TMJ) dysfunction means there are problems with the joint between your jaw and your skull. This is a joint lined by cartilage like other joints in your body but also has a small disc in the joint which keeps the bones from rubbing on each other. These joints are like other joints and can get inflamed (sore) from arthritis and other problems. When this joint gets sore, it can cause headaches and pain in the jaw and the face. CAUSES  Usually the arthritic types of problems are caused by soreness in the joint. Soreness in the joint can also be caused by overuse. This may come from grinding your teeth. It may also come from mis-alignment in the joint. DIAGNOSIS Diagnosis of this condition can often be made by history and exam. Sometimes your caregiver may need X-rays or an MRI scan to determine the exact cause. It may be necessary to see your dentist to determine if your teeth and jaws are lined up correctly. TREATMENT  Most of the time this problem is not serious; however, sometimes it can persist (become chronic). When this happens medications that will cut down on inflammation (soreness) help. Sometimes a shot of cortisone into the joint will be helpful. If your teeth are not aligned it may help for your dentist to make a splint for your mouth that can help this problem. If no physical problems can be found, the problem may come from tension. If tension is found to be the cause, biofeedback or relaxation techniques may be helpful. HOME CARE INSTRUCTIONS   Later in the day, applications of ice packs may be helpful. Ice can be used in a plastic bag with a towel around it to prevent frostbite to skin. This may be used about every 2 hours for 20 to 30 minutes, as needed while awake, or as directed by your caregiver.  Only take over-the-counter or prescription medicines for pain, discomfort, or fever as directed by your caregiver.  If physical therapy was  prescribed, follow your caregiver's directions.  Wear mouth appliances as directed if they were given. Document Released: 05/05/2001 Document Revised: 11/02/2011 Document Reviewed: 08/12/2008 ExitCare Patient Information 2015 Exit[employer], LLC. This information is not intended to replace advice given to you by your health care provider. Make sure you discuss any questions you have with your health care provider.  

## 2015-04-22 NOTE — Progress Notes (Signed)
Pre visit review using our clinic review tool, if applicable. No additional management support is needed unless otherwise documented below in the visit note. 

## 2015-07-02 ENCOUNTER — Encounter: Payer: Self-pay | Admitting: Family Medicine

## 2015-07-02 ENCOUNTER — Ambulatory Visit (INDEPENDENT_AMBULATORY_CARE_PROVIDER_SITE_OTHER): Payer: 59 | Admitting: Family Medicine

## 2015-07-02 VITALS — BP 118/72 | HR 77 | Temp 98.2°F | Resp 18 | Ht 66.0 in | Wt 118.0 lb

## 2015-07-02 DIAGNOSIS — F909 Attention-deficit hyperactivity disorder, unspecified type: Secondary | ICD-10-CM

## 2015-07-02 DIAGNOSIS — Z23 Encounter for immunization: Secondary | ICD-10-CM | POA: Diagnosis not present

## 2015-07-02 DIAGNOSIS — F988 Other specified behavioral and emotional disorders with onset usually occurring in childhood and adolescence: Secondary | ICD-10-CM

## 2015-07-02 MED ORDER — AMPHETAMINE-DEXTROAMPHET ER 30 MG PO CP24: 30.0000 mg | ORAL_CAPSULE | ORAL | Status: DC

## 2015-07-02 NOTE — Progress Notes (Signed)
   Subjective:    Patient ID: Linda Holden, female    DOB: 12/20/1985, 29 y.o.   MRN: [id number]  HPI Attention deficit disorder. Patient remains on Adderall XR 30 milligrams once daily. Denies any headaches. No insomnia. Blood pressure stable. No consistent exercise. Her weight has been stable.  She does plan to start regular exercise program soon. She sees gynecologist regularly. She declines flu vaccine. She is overdue for tetanus and does agree to that.  Past Medical History  Diagnosis Date  . ADD 05/31/2009   Past Surgical History  Procedure Laterality Date  . Tonsillectomy      reports that she has never smoked. She does not have any smokeless tobacco history on file. Her alcohol and drug histories are not on file. family history is not on file. Allergies  Allergen Reactions  . Sulfamethoxazole-Trimethoprim     Mother allergic severe, was told to not let her children have sulfa      Review of Systems  Constitutional: Negative for appetite change, fatigue and unexpected weight change.  Eyes: Negative for visual disturbance.  Respiratory: Negative for cough, chest tightness, shortness of breath and wheezing.   Cardiovascular: Negative for chest pain, palpitations and leg swelling.  Neurological: Negative for dizziness, seizures, syncope, weakness, light-headedness and headaches.       Objective:   Physical Exam  Constitutional: She appears well-developed and well-nourished.  Neck: Neck supple. No thyromegaly present.  Cardiovascular: Normal rate and regular rhythm.  Exam reveals no gallop.   No murmur heard. Pulmonary/Chest: Effort normal and breath sounds normal. No respiratory distress. She has no wheezes. She has no rales.  Musculoskeletal: She exhibits no edema.          Assessment & Plan:  ADD stable. Refill Adderall for 3 months. Flu vaccine offered and declined. Tetanus booster given

## 2015-07-02 NOTE — Addendum Note (Signed)
Addended by: Jimmye NormanPHANOS, Kourosh Jablonsky J on: 07/02/2015 10:18 AM   Modules accepted: Orders

## 2015-08-02 ENCOUNTER — Ambulatory Visit (INDEPENDENT_AMBULATORY_CARE_PROVIDER_SITE_OTHER): Payer: 59 | Admitting: Family Medicine

## 2015-08-02 ENCOUNTER — Ambulatory Visit: Payer: 59 | Admitting: Family Medicine

## 2015-08-02 ENCOUNTER — Encounter: Payer: Self-pay | Admitting: Family Medicine

## 2015-08-02 VITALS — BP 110/80 | HR 115 | Temp 98.3°F | Resp 14 | Ht 66.0 in | Wt 120.5 lb

## 2015-08-02 DIAGNOSIS — J019 Acute sinusitis, unspecified: Secondary | ICD-10-CM | POA: Diagnosis not present

## 2015-08-02 MED ORDER — AMOXICILLIN 875 MG PO TABS: 875.0000 mg | ORAL_TABLET | Freq: Two times a day (BID) | ORAL | Status: DC

## 2015-08-02 NOTE — Patient Instructions (Signed)

## 2015-08-02 NOTE — Progress Notes (Signed)
   Subjective:    Patient ID: Linda FickLindsey Pizzo, female    DOB: 12/06/1985, 29 y.o.   MRN: [id number]  HPI Acute visit.  Onset of typical cold-like symptoms about 2-3 weeks ago. Now has progressive nasal congestion and thick colored nasal discharge early mornings. Maxillary sinus pressure bilaterally.  Denies upper teeth pain. No fever. Increasing productive cough, especially at night. Denies associated fever or chills. No relief with over-the-counter medications.  Nonsmoker. Generally very healthy.  Past Medical History  Diagnosis Date  . ADD 05/31/2009   Past Surgical History  Procedure Laterality Date  . Tonsillectomy      reports that she has never smoked. She does not have any smokeless tobacco history on file. Her alcohol and drug histories are not on file. family history is not on file. Allergies  Allergen Reactions  . Sulfamethoxazole-Trimethoprim     Mother allergic severe, was told to not let her children have sulfa      Review of Systems  Constitutional: Negative for fever and chills.  HENT: Positive for congestion, sinus pressure and sore throat.   Respiratory: Positive for cough.   Neurological: Positive for headaches.       Objective:   Physical Exam  Constitutional: She appears well-developed and well-nourished.  HENT:  Right Ear: External ear normal.  Left Ear: External ear normal.  Mouth/Throat: Oropharynx is clear and moist.  Neck: Neck supple.  Cardiovascular: Normal rate and regular rhythm.   Pulmonary/Chest: Effort normal and breath sounds normal. No respiratory distress. She has no wheezes. She has no rales.  Lymphadenopathy:    She has no cervical adenopathy.          Assessment & Plan:  Bilateral maxillary sinusitis symptoms. Given duration of symptoms start amoxicillin 875 mg twice daily for 10 days. Take over-the-counter Mucinex for congestion and hydrate well. Follow-up as needed

## 2015-08-02 NOTE — Progress Notes (Signed)
Pre visit review using our clinic review tool, if applicable. No additional management support is needed unless otherwise documented below in the visit note. 

## 2015-10-01 ENCOUNTER — Telehealth: Payer: Self-pay | Admitting: Family Medicine

## 2015-10-01 NOTE — Telephone Encounter (Signed)
Patient would like her ADDERALL prescription refilled.

## 2015-10-02 MED ORDER — AMPHETAMINE-DEXTROAMPHET ER 30 MG PO CP24: 30.0000 mg | ORAL_CAPSULE | ORAL | Status: DC

## 2015-10-02 NOTE — Telephone Encounter (Signed)
Last visit to discuss ADHD was 07/02/2015 Last filled 07/02/2015 x3 Okay to refill?

## 2015-10-02 NOTE — Telephone Encounter (Signed)
Printed for signature

## 2015-10-02 NOTE — Telephone Encounter (Signed)
Pt is aware of results. 

## 2015-10-02 NOTE — Telephone Encounter (Signed)
yes

## 2016-01-03 ENCOUNTER — Telehealth: Payer: Self-pay | Admitting: Family Medicine

## 2016-01-03 DIAGNOSIS — Z79899 Other long term (current) drug therapy: Secondary | ICD-10-CM

## 2016-01-03 MED ORDER — AMPHETAMINE-DEXTROAMPHET ER 30 MG PO CP24: 30.0000 mg | ORAL_CAPSULE | ORAL | Status: DC

## 2016-01-03 MED ORDER — AMPHETAMINE-DEXTROAMPHET ER 30 MG PO CP24
30.0000 mg | ORAL_CAPSULE | ORAL | Status: DC
Start: 2016-01-03 — End: 2016-04-03

## 2016-01-03 NOTE — Telephone Encounter (Signed)
Last OV 07/29/2016 Last refill x3 10/02/2015 Please advise

## 2016-01-03 NOTE — Telephone Encounter (Signed)
Refill Adderall OK. She cannot take Lamisil without having recent hepatic panel first.

## 2016-01-03 NOTE — Telephone Encounter (Signed)
Printed for signature

## 2016-01-03 NOTE — Telephone Encounter (Signed)
Pt request refill  amphetamine-dextroamphetamine (ADDERALL XR) 30 MG 24 hr capsule 3 mo supply  Pt states her toenail fungus has returned  And would like a refill of terbinafine (LAMISIL) 250 MG tablet  Pt states Dr told her it may likely return so she hopes she does not need office visit.  [facility]

## 2016-01-06 NOTE — Telephone Encounter (Signed)
Order entered

## 2016-01-06 NOTE — Addendum Note (Signed)
Addended by: Tempie HoistMCNEIL, Hellon Vaccarella M on: 01/06/2016 12:34 PM   Modules accepted: Orders

## 2016-01-06 NOTE — Telephone Encounter (Signed)
Pt is aware rx is ready must have liver panel first before getting lamisil

## 2016-03-31 ENCOUNTER — Telehealth: Payer: Self-pay | Admitting: Family Medicine

## 2016-03-31 NOTE — Telephone Encounter (Signed)
Pt is aware that she is due on 01/03/2016 but would like to pick them up on a Friday. Okay?

## 2016-03-31 NOTE — Telephone Encounter (Signed)
° °  Refill req   amphetamine-dextroamphetamine (ADDERALL XR) 30 MG 24 hr capsule(Expired)

## 2016-03-31 NOTE — Telephone Encounter (Signed)
Left pt a message to call back. Looks like we printed 01/03/2016 x3. Please verify this with her when she calls back. Thanks.

## 2016-03-31 NOTE — Telephone Encounter (Signed)
OK 

## 2016-04-03 MED ORDER — AMPHETAMINE-DEXTROAMPHET ER 30 MG PO CP24: 30.0000 mg | ORAL_CAPSULE | ORAL | 0 refills | Status: DC

## 2016-04-03 NOTE — Telephone Encounter (Signed)
Pt is aware that scripts are up front for pick up. 

## 2016-04-03 NOTE — Addendum Note (Signed)
Addended by: Tempie HoistMCNEIL, AUTUMN M on: 04/03/2016 09:19 AM   Modules accepted: Orders

## 2016-06-30 ENCOUNTER — Telehealth: Payer: Self-pay | Admitting: Family Medicine

## 2016-06-30 NOTE — Telephone Encounter (Signed)
Pt needs generic adderall xr 30 mg

## 2016-06-30 NOTE — Telephone Encounter (Signed)
Pre visit review using our clinic review tool, if applicable. No additional management support is needed unless otherwise documented below in the visit note. 

## 2016-06-30 NOTE — Telephone Encounter (Signed)
Refill OK   Needs office follow up soon.   

## 2016-06-30 NOTE — Telephone Encounter (Signed)
Last OV 08/02/2015 Last refill 04-03-2016 X3 Please advise

## 2016-07-01 MED ORDER — AMPHETAMINE-DEXTROAMPHET ER 30 MG PO CP24: 30.0000 mg | ORAL_CAPSULE | ORAL | 0 refills | Status: DC

## 2016-07-01 NOTE — Telephone Encounter (Signed)
Pt is aware that script is up front for pick up.  

## 2016-07-28 ENCOUNTER — Ambulatory Visit (INDEPENDENT_AMBULATORY_CARE_PROVIDER_SITE_OTHER): Payer: 59 | Admitting: Family Medicine

## 2016-07-28 ENCOUNTER — Encounter: Payer: Self-pay | Admitting: Family Medicine

## 2016-07-28 VITALS — BP 108/62 | HR 87 | Temp 98.2°F | Ht 66.0 in | Wt 116.0 lb

## 2016-07-28 DIAGNOSIS — F988 Other specified behavioral and emotional disorders with onset usually occurring in childhood and adolescence: Secondary | ICD-10-CM | POA: Diagnosis not present

## 2016-07-28 MED ORDER — AMPHETAMINE-DEXTROAMPHET ER 25 MG PO CP24: ORAL_CAPSULE | ORAL | 0 refills | Status: DC

## 2016-07-28 MED ORDER — AMPHETAMINE-DEXTROAMPHET ER 25 MG PO CP24: 25.0000 mg | ORAL_CAPSULE | ORAL | 0 refills | Status: DC

## 2016-07-28 NOTE — Progress Notes (Signed)
Subjective:     Patient ID: Linda FickLindsey Loughmiller, female   DOB: 06/05/1986, 30 y.o.   MRN: [id number]  HPI Here for follow-up regarding attention deficit disorder. She's been for several years on Adderall XR 30 mg daily. She is not aware of trying smaller doses. Her weight is slightly down from last year at 116 pounds compared with 120 pounds last her. She denies any appetite suppression. No insomnia. No palpitations. No consistent exercise.  Past Medical History:  Diagnosis Date  . ADD 05/31/2009   Past Surgical History:  Procedure Laterality Date  . TONSILLECTOMY      reports that she has never smoked. She has never used smokeless tobacco. Her alcohol and drug histories are not on file. family history is not on file. Allergies  Allergen Reactions  . Sulfamethoxazole-Trimethoprim     Mother allergic severe, was told to not let her children have sulfa     Review of Systems  Constitutional: Negative for fatigue.  Eyes: Negative for visual disturbance.  Respiratory: Negative for cough, chest tightness, shortness of breath and wheezing.   Cardiovascular: Negative for chest pain, palpitations and leg swelling.  Neurological: Negative for dizziness, seizures, syncope, weakness, light-headedness and headaches.       Objective:   Physical Exam  Constitutional: She appears well-developed and well-nourished.  Eyes: Pupils are equal, round, and reactive to light.  Neck: Neck supple. No JVD present. No thyromegaly present.  Cardiovascular: Normal rate and regular rhythm.  Exam reveals no gallop.   Pulmonary/Chest: Effort normal and breath sounds normal. No respiratory distress. She has no wheezes. She has no rales.  Musculoskeletal: She exhibits no edema.  Neurological: She is alert.       Assessment:     Attention deficit disorder. Stable    Plan:     -We recommended try reducing her Adderall XR to 25 mg once daily and give some feedback in a month if not doing well this  dosage. -Monitor weight closely and be in touch if she has any further weight loss  Kristian CoveyBruce W Kaile Bixler MD [facility]

## 2016-08-20 ENCOUNTER — Encounter: Payer: Self-pay | Admitting: Family Medicine

## 2016-08-20 ENCOUNTER — Ambulatory Visit (INDEPENDENT_AMBULATORY_CARE_PROVIDER_SITE_OTHER): Payer: 59 | Admitting: Family Medicine

## 2016-08-20 VITALS — BP 116/80 | HR 102 | Temp 98.1°F | Wt 117.0 lb

## 2016-08-20 DIAGNOSIS — J029 Acute pharyngitis, unspecified: Secondary | ICD-10-CM | POA: Diagnosis not present

## 2016-08-20 DIAGNOSIS — J209 Acute bronchitis, unspecified: Secondary | ICD-10-CM | POA: Diagnosis not present

## 2016-08-20 LAB — POCT RAPID STREP A (OFFICE): Rapid Strep A Screen: NEGATIVE

## 2016-08-20 NOTE — Progress Notes (Signed)
Pre visit review using our clinic review tool, if applicable. No additional management support is needed unless otherwise documented below in the visit note. 

## 2016-08-20 NOTE — Progress Notes (Signed)
Subjective:    Patient ID: [name], female    DOB: 10/02/1985, 30 y.o.   MRN: [id number]  HPI  Linda Holden is a 30 year old female who presents today with productive cough that has been present for 2 days. Cough is productive of yellow/green sputum. Associated symptoms of fever of 100.1 F that resolved with tylenol and did not occur again, sore throat, and mild nausea without vomiting that has improved. She denies sinus pressure/pain, nasal congestion, ear pain/pressure, vomiting, diarrhea, fatigue, tooth pain, or myalgias. No recent sick contact exposure at home but notes sick relatives during Christmas gatherings. No recent antibiotic therapy. No history of asthma; reports prior episodes of bronchitis Treatment at home with cough drops and herbal tea has provided limited benefit.   Review of Systems  Constitutional: Positive for fever. Negative for chills and fatigue.  HENT: Positive for sore throat. Negative for congestion, ear pain, postnasal drip, rhinorrhea, sinus pain, sinus pressure and sneezing.   Respiratory: Positive for cough. Negative for shortness of breath and wheezing.   Cardiovascular: Negative for chest pain and palpitations.  Gastrointestinal: Positive for nausea. Negative for abdominal pain, constipation, diarrhea and vomiting.  Musculoskeletal: Negative for myalgias.  Neurological: Negative for dizziness and light-headedness.   Past Medical History:  Diagnosis Date  . ADD 05/31/2009     Social History   Social History  . Marital status: Single    Spouse name: N/A  . Number of children: N/A  . Years of education: N/A   Occupational History  . Not on file.   Social History Main Topics  . Smoking status: Never Smoker  . Smokeless tobacco: Never Used  . Alcohol use Not on file  . Drug use: Unknown  . Sexual activity: Not on file   Other Topics Concern  . Not on file   Social History Narrative  . No narrative on file    Past Surgical History:    Procedure Laterality Date  . TONSILLECTOMY      No family history on file.  Allergies  Allergen Reactions  . Sulfamethoxazole-Trimethoprim     Mother allergic severe, was told to not let her children have sulfa    Current Outpatient Prescriptions on File Prior to Visit  Medication Sig Dispense Refill  . amphetamine-dextroamphetamine (ADDERALL XR) 25 MG 24 hr capsule Take 1 capsule by mouth every morning. 30 capsule 0  . amphetamine-dextroamphetamine (ADDERALL XR) 25 MG 24 hr capsule One po daily-may refill in one month 30 capsule 0  . amphetamine-dextroamphetamine (ADDERALL XR) 25 MG 24 hr capsule One po daily- may refill in two months 30 capsule 0   No current facility-administered medications on file prior to visit.     BP 116/80 (BP Location: Left Arm, Patient Position: Sitting, Cuff Size: Normal)   Pulse (!) 102   Temp 98.1 F (36.7 C) (Oral)   Wt 117 lb (53.1 kg)   SpO2 98%   BMI 18.88 kg/m       Objective:   Physical Exam  Constitutional: She is oriented to person, place, and time. She appears well-developed and well-nourished.  HENT:  Right Ear: Tympanic membrane normal.  Left Ear: Tympanic membrane normal.  Nose: Rhinorrhea present. Right sinus exhibits no maxillary sinus tenderness and no frontal sinus tenderness. Left sinus exhibits no maxillary sinus tenderness and no frontal sinus tenderness.  Mouth/Throat: Mucous membranes are normal. Posterior oropharyngeal erythema present. No oropharyngeal exudate.  Eyes: Pupils are equal, round, and reactive to light. No  scleral icterus.  Neck: Neck supple.  Cardiovascular: Normal rate and regular rhythm.   Pulmonary/Chest: Effort normal and breath sounds normal. She has no wheezes. She has no rales.  Lymphadenopathy:    She has no cervical adenopathy.  Neurological: She is alert and oriented to person, place, and time. Coordination normal.  Skin: Skin is warm and dry. No rash noted.  Psychiatric: She has a normal  mood and affect. Her behavior is normal. Judgment and thought content normal.       Assessment & Plan:  1. Acute bronchitis, unspecified organism Advised patient on supportive measures:  Get rest, drink plenty of fluids, and use tylenol or ibuprofen as needed for pain. Follow up if fever >101, if symptoms worsen or if symptoms are not improved in 3 to 4 days. Patient verbalizes understanding.  Mucinex DM for cough.  2. Sore throat Rapid strep is negative; supportive measures as stated above. - POCT rapid strep A  Patient is currently not taking birth control and is trying to conceive; will defer benzonatate and hycodan as patient is interested in supportive measure first before adding additional medication.  Roddie McJulia Aniylah Avans, FNP-C

## 2016-08-20 NOTE — Patient Instructions (Signed)
Mucinex DM for cough works well. Advised patient on supportive measures:  Get rest, drink plenty of fluids, and use tylenol or ibuprofen as needed for pain. Follow up if fever >101, if symptoms worsen or if symptoms are not improved in 3 to 4 days. Patient verbalizes understanding.    Acute Bronchitis, Adult Acute bronchitis is when air tubes (bronchi) in the lungs suddenly get swollen. The condition can make it hard to breathe. It can also cause these symptoms:  A cough.  Coughing up clear, yellow, or green mucus.  Wheezing.  Chest congestion.  Shortness of breath.  A fever.  Body aches.  Chills.  A sore throat. Follow these instructions at home: Medicines  Take over-the-counter and prescription medicines only as told by your doctor.  If you were prescribed an antibiotic medicine, take it as told by your doctor. Do not stop taking the antibiotic even if you start to feel better. General instructions  Rest.  Drink enough fluids to keep your pee (urine) clear or pale yellow.  Avoid smoking and secondhand smoke. If you smoke and you need help quitting, ask your doctor. Quitting will help your lungs heal faster.  Use an inhaler, cool mist vaporizer, or humidifier as told by your doctor.  Keep all follow-up visits as told by your doctor. This is important. How is this prevented? To lower your risk of getting this condition again:  Wash your hands often with soap and water. If you cannot use soap and water, use hand sanitizer.  Avoid contact with people who have cold symptoms.  Try not to touch your hands to your mouth, nose, or eyes.  Make sure to get the flu shot every year. Contact a doctor if:  Your symptoms do not get better in 2 weeks. Get help right away if:  You cough up blood.  You have chest pain.  You have very bad shortness of breath.  You become dehydrated.  You faint (pass out) or keep feeling like you are going to pass out.  You keep  throwing up (vomiting).  You have a very bad headache.  Your fever or chills gets worse. This information is not intended to replace advice given to you by your health care provider. Make sure you discuss any questions you have with your health care provider. Document Released: 01/27/2008 Document Revised: 03/18/2016 Document Reviewed: 01/29/2016 Elsevier Interactive Patient Education  2017 [employer]

## 2016-10-03 DIAGNOSIS — N3001 Acute cystitis with hematuria: Secondary | ICD-10-CM | POA: Diagnosis not present

## 2016-10-29 DIAGNOSIS — Z01419 Encounter for gynecological examination (general) (routine) without abnormal findings: Secondary | ICD-10-CM | POA: Diagnosis not present

## 2017-03-29 ENCOUNTER — Other Ambulatory Visit (HOSPITAL_COMMUNITY): Payer: Self-pay | Admitting: Obstetrics and Gynecology

## 2017-03-29 DIAGNOSIS — Z3141 Encounter for fertility testing: Secondary | ICD-10-CM

## 2017-03-31 ENCOUNTER — Ambulatory Visit (HOSPITAL_COMMUNITY)
Admission: RE | Admit: 2017-03-31 | Discharge: 2017-03-31 | Disposition: A | Discharge status: Home / Self Care | Payer: 59 | Source: Ambulatory Visit | Attending: Obstetrics and Gynecology | Admitting: Obstetrics and Gynecology

## 2017-03-31 DIAGNOSIS — Z3141 Encounter for fertility testing: Secondary | ICD-10-CM | POA: Insufficient documentation

## 2017-03-31 MED ORDER — IOPAMIDOL (ISOVUE-300) INJECTION 61%
30.0000 mL | Freq: Once | INTRAVENOUS | Status: AC | PRN
  Administered 2017-03-31: 3 mL

## 2017-04-20 DIAGNOSIS — N912 Amenorrhea, unspecified: Secondary | ICD-10-CM | POA: Diagnosis not present

## 2017-04-22 DIAGNOSIS — N912 Amenorrhea, unspecified: Secondary | ICD-10-CM | POA: Diagnosis not present

## 2017-05-04 DIAGNOSIS — N911 Secondary amenorrhea: Secondary | ICD-10-CM | POA: Diagnosis not present

## 2017-05-10 DIAGNOSIS — N911 Secondary amenorrhea: Secondary | ICD-10-CM | POA: Diagnosis not present

## 2017-05-27 LAB — OB RESULTS CONSOLE HEPATITIS B SURFACE ANTIGEN: Hepatitis B Surface Ag: NEGATIVE

## 2017-05-27 LAB — OB RESULTS CONSOLE ABO/RH: RH Type: NEGATIVE

## 2017-05-27 LAB — OB RESULTS CONSOLE HIV ANTIBODY (ROUTINE TESTING): HIV: NONREACTIVE

## 2017-05-27 LAB — OB RESULTS CONSOLE GC/CHLAMYDIA
Chlamydia: NEGATIVE
GC PROBE AMP, GENITAL: NEGATIVE

## 2017-05-27 LAB — OB RESULTS CONSOLE ANTIBODY SCREEN: Antibody Screen: NEGATIVE

## 2017-05-27 LAB — OB RESULTS CONSOLE RPR: RPR: NONREACTIVE

## 2017-05-27 LAB — OB RESULTS CONSOLE RUBELLA ANTIBODY, IGM: Rubella: IMMUNE

## 2017-08-24 NOTE — L&D Delivery Note (Signed)
Delivery Note At 2:18 PM a viable female was delivered via Vaginal, Spontaneous ROA Position   APGAR: 8 and 9 weight  pending .   Placenta status:spontaneously with 3 vessel cord  , .  Cord:  with the following complications:nuchal x 1 loose .  Cord pH: not obtained  Anesthesia:  epidural Episiotomy: None Lacerations: 2nd degree Suture Repair: 2.0 chromic Est. Blood Loss (mL):  300  Mom to postpartum.  Baby to Couplet care / Skin to Skin.  Takyra Cantrall L 12/16/2017, 2:31 PM

## 2017-09-29 DIAGNOSIS — Z3A26 26 weeks gestation of pregnancy: Secondary | ICD-10-CM | POA: Diagnosis not present

## 2017-09-29 DIAGNOSIS — Z23 Encounter for immunization: Secondary | ICD-10-CM | POA: Diagnosis not present

## 2017-09-29 DIAGNOSIS — O4402 Placenta previa specified as without hemorrhage, second trimester: Secondary | ICD-10-CM | POA: Diagnosis not present

## 2017-12-14 ENCOUNTER — Telehealth (HOSPITAL_COMMUNITY): Payer: Self-pay | Admitting: *Deleted

## 2017-12-14 ENCOUNTER — Encounter (HOSPITAL_COMMUNITY): Payer: Self-pay | Admitting: *Deleted

## 2017-12-14 LAB — OB RESULTS CONSOLE GBS: STREP GROUP B AG: NEGATIVE

## 2017-12-14 NOTE — Telephone Encounter (Signed)
Preadmission screen  

## 2017-12-15 ENCOUNTER — Inpatient Hospital Stay (HOSPITAL_COMMUNITY)
Admission: AD | Admit: 2017-12-15 | Discharge: 2017-12-16 | Disposition: A | Discharge status: Home / Self Care | Payer: 59 | Source: Ambulatory Visit | Attending: Obstetrics and Gynecology | Admitting: Obstetrics and Gynecology

## 2017-12-15 ENCOUNTER — Encounter (HOSPITAL_COMMUNITY): Payer: Self-pay | Admitting: *Deleted

## 2017-12-15 DIAGNOSIS — O479 False labor, unspecified: Secondary | ICD-10-CM

## 2017-12-15 MED ORDER — ZOLPIDEM TARTRATE 5 MG PO TABS
5.0000 mg | ORAL_TABLET | Freq: Once | ORAL | Status: AC
  Administered 2017-12-16: 5 mg via ORAL
  Filled 2017-12-15: qty 1

## 2017-12-15 MED ORDER — ZOLPIDEM TARTRATE 5 MG PO TABS: 5.0000 mg | ORAL_TABLET | Freq: Once | ORAL | 0 refills | Status: DC

## 2017-12-15 NOTE — Discharge Instructions (Signed)
Braxton Hicks Contractions °Contractions of the uterus can occur throughout pregnancy, but they are not always a sign that you are in labor. You may have practice contractions called Braxton Hicks contractions. These false labor contractions are sometimes confused with true labor. °What are Braxton Hicks contractions? °Braxton Hicks contractions are tightening movements that occur in the muscles of the uterus before labor. Unlike true labor contractions, these contractions do not result in opening (dilation) and thinning of the cervix. Toward the end of pregnancy (32-34 weeks), Braxton Hicks contractions can happen more often and may become stronger. These contractions are sometimes difficult to tell apart from true labor because they can be very uncomfortable. You should not feel embarrassed if you go to the hospital with false labor. °Sometimes, the only way to tell if you are in true labor is for your health care provider to look for changes in the cervix. The health care provider will do a physical exam and may monitor your contractions. If you are not in true labor, the exam should show that your cervix is not dilating and your water has not broken. °If there are other health problems associated with your pregnancy, it is completely safe for you to be sent home with false labor. You may continue to have Braxton Hicks contractions until you go into true labor. °How to tell the difference between true labor and false labor °True labor °· Contractions last 30-70 seconds. °· Contractions become very regular. °· Discomfort is usually felt in the top of the uterus, and it spreads to the lower abdomen and low back. °· Contractions do not go away with walking. °· Contractions usually become more intense and increase in frequency. °· The cervix dilates and gets thinner. °False labor °· Contractions are usually shorter and not as strong as true labor contractions. °· Contractions are usually irregular. °· Contractions  are often felt in the front of the lower abdomen and in the groin. °· Contractions may go away when you walk around or change positions while lying down. °· Contractions get weaker and are shorter-lasting as time goes on. °· The cervix usually does not dilate or become thin. °Follow these instructions at home: °· Take over-the-counter and prescription medicines only as told by your health care provider. °· Keep up with your usual exercises and follow other instructions from your health care provider. °· Eat and drink lightly if you think you are going into labor. °· If Braxton Hicks contractions are making you uncomfortable: °? Change your position from lying down or resting to walking, or change from walking to resting. °? Sit and rest in a tub of warm water. °? Drink enough fluid to keep your urine pale yellow. Dehydration may cause these contractions. °? Do slow and deep breathing several times an hour. °· Keep all follow-up prenatal visits as told by your health care provider. This is important. °Contact a health care provider if: °· You have a fever. °· You have continuous pain in your abdomen. °Get help right away if: °· Your contractions become stronger, more regular, and closer together. °· You have fluid leaking or gushing from your vagina. °· You pass blood-tinged mucus (bloody show). °· You have bleeding from your vagina. °· You have low back pain that you never had before. °· You feel your baby’s head pushing down and causing pelvic pressure. °· Your baby is not moving inside you as much as it used to. °Summary °· Contractions that occur before labor are called Braxton   Hicks contractions, false labor, or practice contractions. °· Braxton Hicks contractions are usually shorter, weaker, farther apart, and less regular than true labor contractions. True labor contractions usually become progressively stronger and regular and they become more frequent. °· Manage discomfort from Braxton Hicks contractions by  changing position, resting in a warm bath, drinking plenty of water, or practicing deep breathing. °This information is not intended to replace advice given to you by your health care provider. Make sure you discuss any questions you have with your health care provider. °Document Released: 12/24/2016 Document Revised: 12/24/2016 Document Reviewed: 12/24/2016 °Elsevier Interactive Patient Education © 2018 [employer] Inc[employer] ° °Fetal Movement Counts °Patient Name: ________________________________________________ Patient Due Date: ____________________ °What is a fetal movement count? °A fetal movement count is the number of times that you feel your baby move during a certain amount of time. This may also be called a fetal kick count. A fetal movement count is recommended for every pregnant woman. You may be asked to start counting fetal movements as early as week 28 of your pregnancy. °Pay attention to when your baby is most active. You may notice your baby's sleep and wake cycles. You may also notice things that make your baby move more. You should do a fetal movement count: °· When your baby is normally most active. °· At the same time each day. ° °A good time to count movements is while you are resting, after having something to eat and drink. °How do I count fetal movements? °1. Find a quiet, comfortable area. Sit, or lie down on your side. °2. Write down the date, the start time and stop time, and the number of movements that you felt between those two times. Take this information with you to your health care visits. °3. For 2 hours, count kicks, flutters, swishes, rolls, and jabs. You should feel at least 10 movements during 2 hours. °4. You may stop counting after you have felt 10 movements. °5. If you do not feel 10 movements in 2 hours, have something to eat and drink. Then, keep resting and counting for 1 hour. If you feel at least 4 movements during that hour, you may stop counting. °Contact a health care  provider if: °· You feel fewer than 4 movements in 2 hours. °· Your baby is not moving like he or she usually does. °Date: ____________ Start time: ____________ Stop time: ____________ Movements: ____________ °Date: ____________ Start time: ____________ Stop time: ____________ Movements: ____________ °Date: ____________ Start time: ____________ Stop time: ____________ Movements: ____________ °Date: ____________ Start time: ____________ Stop time: ____________ Movements: ____________ °Date: ____________ Start time: ____________ Stop time: ____________ Movements: ____________ °Date: ____________ Start time: ____________ Stop time: ____________ Movements: ____________ °Date: ____________ Start time: ____________ Stop time: ____________ Movements: ____________ °Date: ____________ Start time: ____________ Stop time: ____________ Movements: ____________ °Date: ____________ Start time: ____________ Stop time: ____________ Movements: ____________ °This information is not intended to replace advice given to you by your health care provider. Make sure you discuss any questions you have with your health care provider. °Document Released: 09/09/2006 Document Revised: 04/08/2016 Document Reviewed: 09/19/2015 °Elsevier Interactive Patient Education © 2018 Elsevier Inc. ° °

## 2017-12-15 NOTE — MAU Note (Signed)
Urine sent to lab 

## 2017-12-16 ENCOUNTER — Inpatient Hospital Stay (HOSPITAL_COMMUNITY): Payer: 59 | Admitting: Anesthesiology

## 2017-12-16 ENCOUNTER — Inpatient Hospital Stay (HOSPITAL_COMMUNITY)
Admission: AD | Admit: 2017-12-16 | Discharge: 2017-12-18 | DRG: 807 | Disposition: A | Discharge status: Home / Self Care | Payer: 59 | Source: Ambulatory Visit | Attending: Obstetrics and Gynecology | Admitting: Obstetrics and Gynecology

## 2017-12-16 ENCOUNTER — Encounter (HOSPITAL_COMMUNITY): Payer: Self-pay | Admitting: *Deleted

## 2017-12-16 DIAGNOSIS — Z3483 Encounter for supervision of other normal pregnancy, third trimester: Secondary | ICD-10-CM | POA: Diagnosis not present

## 2017-12-16 DIAGNOSIS — O9902 Anemia complicating childbirth: Secondary | ICD-10-CM | POA: Diagnosis not present

## 2017-12-16 DIAGNOSIS — D649 Anemia, unspecified: Secondary | ICD-10-CM | POA: Diagnosis present

## 2017-12-16 DIAGNOSIS — Z3A37 37 weeks gestation of pregnancy: Secondary | ICD-10-CM | POA: Diagnosis not present

## 2017-12-16 LAB — CBC
HCT: 32.3 % — ABNORMAL LOW (ref 36.0–46.0)
HEMOGLOBIN: 10.9 g/dL — AB (ref 12.0–15.0)
MCH: 30.4 pg (ref 26.0–34.0)
MCHC: 33.7 g/dL (ref 30.0–36.0)
MCV: 90 fL (ref 78.0–100.0)
Platelets: 246 10*3/uL (ref 150–400)
RBC: 3.59 MIL/uL — ABNORMAL LOW (ref 3.87–5.11)
RDW: 13.4 % (ref 11.5–15.5)
WBC: 12.9 10*3/uL — ABNORMAL HIGH (ref 4.0–10.5)

## 2017-12-16 LAB — RPR: RPR Ser Ql: NONREACTIVE

## 2017-12-16 MED ORDER — MEDROXYPROGESTERONE ACETATE 150 MG/ML IM SUSP: 150.0000 mg | INTRAMUSCULAR | Status: DC | PRN

## 2017-12-16 MED ORDER — OXYCODONE-ACETAMINOPHEN 5-325 MG PO TABS: 1.0000 | ORAL_TABLET | ORAL | Status: DC | PRN

## 2017-12-16 MED ORDER — ONDANSETRON HCL 4 MG/2ML IJ SOLN: 4.0000 mg | INTRAMUSCULAR | Status: DC | PRN

## 2017-12-16 MED ORDER — OXYTOCIN 40 UNITS IN LACTATED RINGERS INFUSION - SIMPLE MED
1.0000 m[IU]/min | INTRAVENOUS | Status: DC
  Administered 2017-12-16: 2 m[IU]/min via INTRAVENOUS

## 2017-12-16 MED ORDER — FLEET ENEMA 7-19 GM/118ML RE ENEM: 1.0000 | ENEMA | Freq: Every day | RECTAL | Status: DC | PRN

## 2017-12-16 MED ORDER — SOD CITRATE-CITRIC ACID 500-334 MG/5ML PO SOLN: 30.0000 mL | ORAL | Status: DC | PRN

## 2017-12-16 MED ORDER — DIPHENHYDRAMINE HCL 50 MG/ML IJ SOLN: 12.5000 mg | INTRAMUSCULAR | Status: DC | PRN

## 2017-12-16 MED ORDER — EPHEDRINE 5 MG/ML INJ
10.0000 mg | INTRAVENOUS | Status: DC | PRN
  Filled 2017-12-16: qty 2

## 2017-12-16 MED ORDER — LIDOCAINE HCL (PF) 1 % IJ SOLN
30.0000 mL | INTRAMUSCULAR | Status: DC | PRN
  Filled 2017-12-16: qty 30

## 2017-12-16 MED ORDER — OXYTOCIN BOLUS FROM INFUSION
500.0000 mL | Freq: Once | INTRAVENOUS | Status: AC
  Administered 2017-12-16: 500 mL via INTRAVENOUS

## 2017-12-16 MED ORDER — IBUPROFEN 600 MG PO TABS
600.0000 mg | ORAL_TABLET | Freq: Four times a day (QID) | ORAL | Status: DC
  Administered 2017-12-16 – 2017-12-18 (×7): 600 mg via ORAL
  Filled 2017-12-16 (×8): qty 1

## 2017-12-16 MED ORDER — WITCH HAZEL-GLYCERIN EX PADS
1.0000 "application " | MEDICATED_PAD | CUTANEOUS | Status: DC | PRN
  Administered 2017-12-18: 1 via TOPICAL

## 2017-12-16 MED ORDER — SENNOSIDES-DOCUSATE SODIUM 8.6-50 MG PO TABS
2.0000 | ORAL_TABLET | ORAL | Status: DC
  Administered 2017-12-16 – 2017-12-17 (×2): 2 via ORAL
  Filled 2017-12-16 (×2): qty 2

## 2017-12-16 MED ORDER — OXYCODONE-ACETAMINOPHEN 5-325 MG PO TABS: 2.0000 | ORAL_TABLET | ORAL | Status: DC | PRN

## 2017-12-16 MED ORDER — PHENYLEPHRINE 40 MCG/ML (10ML) SYRINGE FOR IV PUSH (FOR BLOOD PRESSURE SUPPORT)
80.0000 ug | PREFILLED_SYRINGE | INTRAVENOUS | Status: DC | PRN
  Filled 2017-12-16: qty 5
  Filled 2017-12-16: qty 10

## 2017-12-16 MED ORDER — ACETAMINOPHEN 325 MG PO TABS: 650.0000 mg | ORAL_TABLET | ORAL | Status: DC | PRN

## 2017-12-16 MED ORDER — BENZOCAINE-MENTHOL 20-0.5 % EX AERO
1.0000 "application " | INHALATION_SPRAY | CUTANEOUS | Status: DC | PRN
  Administered 2017-12-16: 1 via TOPICAL
  Filled 2017-12-16: qty 56

## 2017-12-16 MED ORDER — LIDOCAINE HCL (PF) 1 % IJ SOLN
INTRAMUSCULAR | Status: DC | PRN
  Administered 2017-12-16: 5 mL
  Administered 2017-12-16: 2 mL
  Administered 2017-12-16: 3 mL

## 2017-12-16 MED ORDER — DIBUCAINE 1 % RE OINT
1.0000 "application " | TOPICAL_OINTMENT | RECTAL | Status: DC | PRN
  Administered 2017-12-18: 1 via RECTAL
  Filled 2017-12-16: qty 28

## 2017-12-16 MED ORDER — BISACODYL 10 MG RE SUPP: 10.0000 mg | Freq: Every day | RECTAL | Status: DC | PRN

## 2017-12-16 MED ORDER — BUPIVACAINE HCL (PF) 0.25 % IJ SOLN
INTRAMUSCULAR | Status: DC | PRN
  Administered 2017-12-16: 3 mL via EPIDURAL
  Administered 2017-12-16: 5 mL via EPIDURAL

## 2017-12-16 MED ORDER — FLEET ENEMA 7-19 GM/118ML RE ENEM: 1.0000 | ENEMA | RECTAL | Status: DC | PRN

## 2017-12-16 MED ORDER — OXYTOCIN 40 UNITS IN LACTATED RINGERS INFUSION - SIMPLE MED
2.5000 [IU]/h | INTRAVENOUS | Status: DC
  Filled 2017-12-16: qty 1000

## 2017-12-16 MED ORDER — PHENYLEPHRINE 40 MCG/ML (10ML) SYRINGE FOR IV PUSH (FOR BLOOD PRESSURE SUPPORT)
80.0000 ug | PREFILLED_SYRINGE | INTRAVENOUS | Status: DC | PRN
  Filled 2017-12-16: qty 5

## 2017-12-16 MED ORDER — PRENATAL MULTIVITAMIN CH
1.0000 | ORAL_TABLET | Freq: Every day | ORAL | Status: DC
  Administered 2017-12-17 – 2017-12-18 (×2): 1 via ORAL
  Filled 2017-12-16 (×2): qty 1

## 2017-12-16 MED ORDER — COCONUT OIL OIL: 1.0000 "application " | TOPICAL_OIL | Status: DC | PRN

## 2017-12-16 MED ORDER — MEASLES, MUMPS & RUBELLA VAC ~~LOC~~ INJ: 0.5000 mL | INJECTION | Freq: Once | SUBCUTANEOUS | Status: DC

## 2017-12-16 MED ORDER — SIMETHICONE 80 MG PO CHEW: 80.0000 mg | CHEWABLE_TABLET | ORAL | Status: DC | PRN

## 2017-12-16 MED ORDER — LACTATED RINGERS IV SOLN
INTRAVENOUS | Status: DC
  Administered 2017-12-16 (×2): via INTRAVENOUS

## 2017-12-16 MED ORDER — ONDANSETRON HCL 4 MG PO TABS: 4.0000 mg | ORAL_TABLET | ORAL | Status: DC | PRN

## 2017-12-16 MED ORDER — LACTATED RINGERS IV SOLN: 500.0000 mL | Freq: Once | INTRAVENOUS | Status: DC

## 2017-12-16 MED ORDER — LACTATED RINGERS IV SOLN: 500.0000 mL | INTRAVENOUS | Status: DC | PRN

## 2017-12-16 MED ORDER — ACETAMINOPHEN 325 MG PO TABS
650.0000 mg | ORAL_TABLET | ORAL | Status: DC | PRN
  Administered 2017-12-16: 650 mg via ORAL
  Filled 2017-12-16: qty 2

## 2017-12-16 MED ORDER — TETANUS-DIPHTH-ACELL PERTUSSIS 5-2.5-18.5 LF-MCG/0.5 IM SUSP: 0.5000 mL | Freq: Once | INTRAMUSCULAR | Status: DC

## 2017-12-16 MED ORDER — FENTANYL 2.5 MCG/ML BUPIVACAINE 1/10 % EPIDURAL INFUSION (WH - ANES)
14.0000 mL/h | INTRAMUSCULAR | Status: DC | PRN
  Administered 2017-12-16: 14 mL/h via EPIDURAL
  Filled 2017-12-16: qty 100

## 2017-12-16 MED ORDER — TERBUTALINE SULFATE 1 MG/ML IJ SOLN
0.2500 mg | Freq: Once | INTRAMUSCULAR | Status: DC | PRN
  Filled 2017-12-16: qty 1

## 2017-12-16 MED ORDER — ZOLPIDEM TARTRATE 5 MG PO TABS: 5.0000 mg | ORAL_TABLET | Freq: Every evening | ORAL | Status: DC | PRN

## 2017-12-16 MED ORDER — DIPHENHYDRAMINE HCL 25 MG PO CAPS: 25.0000 mg | ORAL_CAPSULE | Freq: Four times a day (QID) | ORAL | Status: DC | PRN

## 2017-12-16 MED ORDER — ONDANSETRON HCL 4 MG/2ML IJ SOLN: 4.0000 mg | Freq: Four times a day (QID) | INTRAMUSCULAR | Status: DC | PRN

## 2017-12-16 NOTE — Anesthesia Preprocedure Evaluation (Signed)
Anesthesia Evaluation  Patient identified by MRN, date of birth, ID band Patient awake    Reviewed: Allergy & Precautions, NPO status , Patient's Chart, lab work & pertinent test results  History of Anesthesia Complications (+) PONV  Airway Mallampati: II  TM Distance: >3 FB     Dental   Pulmonary neg pulmonary ROS,    Pulmonary exam normal        Cardiovascular negative cardio ROS Normal cardiovascular exam     Neuro/Psych negative neurological ROS     GI/Hepatic negative GI ROS, Neg liver ROS,   Endo/Other  negative endocrine ROS  Renal/GU negative Renal ROS     Musculoskeletal   Abdominal   Peds  Hematology  (+) anemia ,   Anesthesia Other Findings   Reproductive/Obstetrics (+) Pregnancy                             Lab Results  Component Value Date   WBC 12.9 (H) 12/16/2017   HGB 10.9 (L) 12/16/2017   HCT 32.3 (L) 12/16/2017   MCV 90.0 12/16/2017   PLT 246 12/16/2017    Anesthesia Physical Anesthesia Plan  ASA: II  Anesthesia Plan: Epidural   Post-op Pain Management:    Induction:   PONV Risk Score and Plan: Treatment may vary due to age or medical condition  Airway Management Planned: Natural Airway  Additional Equipment:   Intra-op Plan:   Post-operative Plan:   Informed Consent: I have reviewed the patients History and Physical, chart, labs and discussed the procedure including the risks, benefits and alternatives for the proposed anesthesia with the patient or authorized representative who has indicated his/her understanding and acceptance.     Plan Discussed with:   Anesthesia Plan Comments:         Anesthesia Quick Evaluation

## 2017-12-16 NOTE — Lactation Note (Signed)
This note was copied from a baby'Linda chart. Lactation Consultation Note  Patient Name: Linda Holden ZOXWR'UToday'Linda Date: 12/16/2017 Reason for consult: Initial assessment;Primapara;1st time breastfeeding;Difficult latch;Early term 6537-38.6wks  9 hours old early term female who is being exclusively BF by his mother; she'Linda a P1. RN called lactation to assist with latch, baby has not fed since birth. All he had so far were attempts, offered assistance with latch and baby would not latch, LC was able to wake him up, but when took him to the right breast on cross cradle position he wouldn't latch, LC did some suck training and baby did not have a sucking pattern, it was very uncoordinated, more of a thrust/bite.  Taught mom how to hand express and she was able to express 2 drops of colostrum, LC fed those to baby with finger feeding, baby'Linda sucking pattern did not change much, he only sucked for about 5 seconds.  Asked mom what were her plans on feeding baby and how does she feel about pumping, mom said she wants to BF and would be willing to pump while in the hospital. Set her up with a DEBP, reviewed pump instructions, cleaning and storage. Mom will pump every 3 hours and at least once at night.  Encouraged mom to keep putting baby to the breast at least 8-12 times/24 hours or sooner if feeding cues are present. If no cues within a 3 hour period, mom will wake baby up to finger feed or spoon feed him any EBM she has pumped. Reviewed BF brochure, BF resources and feeding diary, parents are aware of LC services and will call PRN.  Maternal Data Formula Feeding for Exclusion: No Has patient been taught Hand Expression?: Yes Does the patient have breastfeeding experience prior to this delivery?: No  Feeding Feeding Type: Breast Fed Length of feed: 0 min(few suckles, very sleepy)  Interventions Interventions: Breast feeding basics reviewed;Assisted with latch;Skin to skin;Breast massage;Hand express;Breast  compression;Adjust position;Support pillows;Expressed milk;DEBP  Lactation Tools Discussed/Used Tools: Pump Breast pump type: Double-Electric Breast Pump WIC Program: No Pump Review: Setup, frequency, and cleaning Initiated by:: MPeck Date initiated:: 12/16/17   Consult Status Consult Status: Follow-up Date: 12/17/17 Follow-up type: In-patient    Linda Holden Linda ConstableS Danton Holden 12/16/2017, 11:39 PM

## 2017-12-16 NOTE — MAU Note (Signed)
I have communicated with TOMBLIN and reviewed vital signs:  Vitals:   12/16/17 0012 12/16/17 0015  BP: 127/78 115/68  Pulse: 78 83    Vaginal exam:  Dilation: 3 Effacement (%): 70 Cervical Position: Anterior Station: Ballotable Presentation: Vertex Exam by:: Rockwell GermanyLauren Fields RN ,   Also reviewed contraction pattern and that non-stress test is reactive.  It has been documented that patient is contracting every 3-4 minutes with minimal cervical change over 1.5 hours not indicating active labor.  Patient denies any other complaints.  Based on this report provider has given order for discharge.  A discharge order and diagnosis entered by a provider.   Labor discharge instructions reviewed with patient.

## 2017-12-16 NOTE — Progress Notes (Signed)
Patient assisted to the bathroom, feeling light headed and faint sitting on the toilet. Called for assistance, ammonia inhalant used and patient assisted back to bed with the stedy. Vital signs stable, patient feeling better after back in the bed. Talking with pediatrician now.

## 2017-12-16 NOTE — Anesthesia Procedure Notes (Signed)
Epidural Patient location during procedure: OB Start time: 12/16/2017 9:35 AM End time: 12/16/2017 9:41 AM  Preanesthetic Checklist Completed: patient identified, site marked, surgical consent, pre-op evaluation, timeout performed, IV checked, risks and benefits discussed and monitors and equipment checked  Epidural Patient position: sitting Prep: site prepped and draped and DuraPrep Patient monitoring: continuous pulse ox and blood pressure Approach: midline Location: L4-L5 Injection technique: LOR air  Needle:  Needle type: Tuohy  Needle gauge: 17 G Needle length: 9 cm and 9 Needle insertion depth: 6 cm Catheter type: closed end flexible Catheter size: 19 Gauge Catheter at skin depth: 11 cm Test dose: negative  Assessment Events: blood not aspirated, injection not painful, no injection resistance, negative IV test and no paresthesia

## 2017-12-16 NOTE — H&P (Signed)
Linda Holden is a 32 y.o. G 1 P 0 at 37 w 5 days presents in labor Now status post epidural OB History    Gravida  1   Para      Term      Preterm      AB      Living        SAB      TAB      Ectopic      Multiple      Live Births             Past Medical History:  Diagnosis Date  . ADD 05/31/2009  . PONV (postoperative nausea and vomiting)    Past Surgical History:  Procedure Laterality Date  . TONSILLECTOMY    . WISDOM TOOTH EXTRACTION     Family History: family history includes Breast cancer in her paternal grandmother; Depression in her mother; Diabetes in her maternal uncle; Fibromyalgia in her mother; Hypertension in her father and maternal grandfather. Social History:  reports that she has never smoked. She has never used smokeless tobacco. She reports that she drank alcohol. She reports that she does not use drugs.     Maternal Diabetes: No Genetic Screening: Normal Maternal Ultrasounds/Referrals: Normal Fetal Ultrasounds or other Referrals:  None Maternal Substance Abuse:  No Significant Maternal Medications:  None Significant Maternal Lab Results:  None Other Comments:  None  Review of Systems  All other systems reviewed and are negative.  Maternal Medical History:  Reason for admission: Contractions.     Dilation: 3.5 Effacement (%): 70 Station: -2 Exam by:: Dorrene GermanJ. Lowe RN Blood pressure 122/73, pulse 70, temperature 98.3 F (36.8 C), temperature source Oral, resp. rate 18, height 5\' 7"  (1.702 m), weight 80.7 kg (178 lb), last menstrual period 03/24/2017, SpO2 99 %. Maternal Exam:  Abdomen: Fetal presentation: vertex     Fetal Exam Fetal State Assessment: Category I - tracings are normal.     Physical Exam  Nursing note and vitals reviewed. Constitutional: She appears well-developed.  HENT:  Head: Normocephalic.  Eyes: Pupils are equal, round, and reactive to light.  Neck: Normal range of motion.  Cardiovascular: Normal  rate and regular rhythm.  Respiratory: Effort normal.    Prenatal labs: ABO, Rh: --/--/A NEG (04/25 0839) Antibody: POS (04/25 0839) Rubella: Immune (10/04 0000) RPR: Nonreactive (10/04 0000)  HBsAg: Negative (10/04 0000)  HIV: Non-reactive (10/04 0000)  GBS: Negative (04/23 0000)   Assessment/Plan: IUP at 37 w 5 days Labor Follow labor curve   Linda Holden L 12/16/2017, 10:00 AM

## 2017-12-16 NOTE — Anesthesia Postprocedure Evaluation (Signed)
Anesthesia Post Note  Patient: Linda Holden  Procedure(s) Performed: AN AD HOC LABOR EPIDURAL     Patient location during evaluation: Mother Baby Anesthesia Type: Epidural Level of consciousness: awake and alert and oriented Pain management: satisfactory to patient Vital Signs Assessment: post-procedure vital signs reviewed and stable Respiratory status: spontaneous breathing and nonlabored ventilation Cardiovascular status: stable Postop Assessment: no headache, no backache, no signs of nausea or vomiting, adequate PO intake and patient able to bend at knees (patient up walking) Anesthetic complications: no    Last Vitals:  Vitals:   12/16/17 1705 12/16/17 1805  BP: 126/72 127/64  Pulse: 98 63  Resp: 20   Temp: 36.9 C   SpO2:      Last Pain:  Vitals:   12/16/17 1705  TempSrc: Oral  PainSc:    Pain Goal: Patients Stated Pain Goal: 0 (12/16/17 0902)               Madison HickmanGREGORY,Doyel Mulkern

## 2017-12-16 NOTE — MAU Note (Signed)
Ctx on and off since yesterday. Denies any vag bleeding or leaking a t this time. Good fetal movement felt. 2-3cm last night.

## 2017-12-17 LAB — CBC
HEMATOCRIT: 28.2 % — AB (ref 36.0–46.0)
HEMOGLOBIN: 9.4 g/dL — AB (ref 12.0–15.0)
MCH: 30.4 pg (ref 26.0–34.0)
MCHC: 33.3 g/dL (ref 30.0–36.0)
MCV: 91.3 fL (ref 78.0–100.0)
Platelets: 208 10*3/uL (ref 150–400)
RBC: 3.09 MIL/uL — ABNORMAL LOW (ref 3.87–5.11)
RDW: 13.7 % (ref 11.5–15.5)
WBC: 13.9 10*3/uL — ABNORMAL HIGH (ref 4.0–10.5)

## 2017-12-17 MED ORDER — RHO D IMMUNE GLOBULIN 1500 UNIT/2ML IJ SOSY
300.0000 ug | PREFILLED_SYRINGE | Freq: Once | INTRAMUSCULAR | Status: AC
  Administered 2017-12-17: 300 ug via INTRAVENOUS
  Filled 2017-12-17: qty 2

## 2017-12-17 NOTE — Lactation Note (Signed)
This note was copied from a baby's chart. Lactation Consultation Note  Patient Name: Linda Holden FickLindsey Kinley ZOXWR'UToday's Date: 12/17/2017 Reason for consult: Follow-up assessment;1st time breastfeeding;Primapara;Difficult latch;Early term 2837-38.6wks  27 hours old female who is now being partially BF and formula fed by his mother. Baby still having difficulty to latch, RN asked for lactation assistance. Baby was already crying when entering the room, he was also very jittery, and unable to suck, tried suck training with a gloved finger but no sucking reflex elicit. Asked mom if she had any colostrum and per mom she hasn't pumped since 1 pm and she didn't get anything other than drops. Explained to mom the importance of consistent pumping; she verbalized understanding.  Baby kept cueing and crying, he showed signs of hunger and per mom he's only latched once for a 10 minute feeding earlier today but she wasn't sure if he transferred at all. Baby would not latch this time either, tried cross cradle position on the right breast and he just kept crying. Mom only able to get 2 drops of colostrum with hand expression. Parents were informed about the pros and the cons of supplementing with formula when baby reaches the 24 hour mark and hasn't fed yet. Both parents agreed to supplement baby with Similac Advanced 19 cal formula.  LC showed baby how to finger feed with a curved tip syringe, baby took 6 ml. Formula supplementation guidelines and storage was also reviewed. Mom will continue putting baby to the breast STS on cues 8-12 times/24 hours, and will try to pump every 3 hours. They'll also supplement baby with Similac Advance 19 calorie formula after every feeding according the baby's age. The instrument of choice will be a curved tip syringe, but a slow flow nipple was also left in the room in case baby cluster fed tonight or parents need an alternative method of feeding. Both parents aware of LC services and will call  PRN.  Feeding Feeding Type: Formula  LATCH Score Latch: Repeated attempts needed to sustain latch, nipple held in mouth throughout feeding, stimulation needed to elicit sucking reflex.  Audible Swallowing: A few with stimulation  Type of Nipple: Everted at rest and after stimulation  Comfort (Breast/Nipple): Soft / non-tender  Hold (Positioning): Assistance needed to correctly position infant at breast and maintain latch.  LATCH Score: 7  Interventions Interventions: Breast feeding basics reviewed;Assisted with latch;Skin to skin;Support pillows;Position options;DEBP  Lactation Tools Discussed/Used     Consult Status Consult Status: Follow-up Date: 12/18/17 Follow-up type: In-patient    Izamar Linden Venetia ConstableS Meesha Sek 12/17/2017, 5:33 PM

## 2017-12-17 NOTE — Progress Notes (Signed)
Post Partum Day 1 Subjective: no complaints  Objective: Blood pressure 105/69, pulse 70, temperature 97.9 F (36.6 C), temperature source Oral, resp. rate 18, height 5\' 7"  (1.702 m), weight 178 lb (80.7 kg), last menstrual period 03/24/2017, SpO2 99 %, unknown if currently breastfeeding.  Physical Exam:  General: alert and cooperative Lochia: appropriate Uterine Fundus: firm Incision: n/a DVT Evaluation: No evidence of DVT seen on physical exam.  Recent Labs    12/16/17 0839 12/17/17 0513  HGB 10.9* 9.4*  HCT 32.3* 28.2*    Assessment/Plan: Plan for discharge tomorrow, Breastfeeding and Circumcision prior to discharge   LOS: 1 day   Linda Holden 12/17/2017, 9:04 AM

## 2017-12-18 LAB — RH IG WORKUP (INCLUDES ABO/RH)
ABO/RH(D): A NEG
Fetal Screen: NEGATIVE
Gestational Age(Wks): 37.5
UNIT DIVISION: 0

## 2017-12-18 MED ORDER — IBUPROFEN 600 MG PO TABS: 600.0000 mg | ORAL_TABLET | Freq: Four times a day (QID) | ORAL | 0 refills | Status: AC

## 2017-12-18 NOTE — Anesthesia Postprocedure Evaluation (Deleted)
Anesthesia Post Note  Patient: Linda Holden  Procedure(s) Performed: AN AD HOC LABOR EPIDURAL     Patient location during evaluation: Mother Baby Anesthesia Type: Epidural Level of consciousness: awake and alert Pain management: pain level controlled Vital Signs Assessment: post-procedure vital signs reviewed and stable Respiratory status: spontaneous breathing Cardiovascular status: blood pressure returned to baseline Postop Assessment: no headache, patient able to bend at knees, no backache, no apparent nausea or vomiting, epidural receding and adequate PO intake Anesthetic complications: no    Last Vitals:  Vitals:   12/17/17 2330 12/18/17 0536  BP: 106/63 113/68  Pulse: 71 64  Resp: 18 18  Temp: 36.4 C 36.9 C  SpO2:      Last Pain:  Vitals:   12/18/17 0536  TempSrc: Oral  PainSc: 4    Pain Goal: Patients Stated Pain Goal: 3 (12/17/17 1828)               Salome Arnt

## 2017-12-18 NOTE — Discharge Summary (Signed)
Obstetric Discharge Summary Reason for Admission: onset of labor Prenatal Procedures: ultrasound Intrapartum Procedures: spontaneous vaginal delivery Postpartum Procedures: none Complications-Operative and Postpartum: none Hemoglobin  Date Value Ref Range Status  12/17/2017 9.4 (L) 12.0 - 15.0 g/dL Final   HCT  Date Value Ref Range Status  12/17/2017 28.2 (L) 36.0 - 46.0 % Final    Physical Exam:  General: alert and cooperative Lochia: appropriate Uterine Fundus: firm Incision: n/a DVT Evaluation: No evidence of DVT seen on physical exam.  Discharge Diagnoses: Term Pregnancy-delivered  Discharge Information: Date: 12/18/2017 Activity: pelvic rest Diet: routine Medications: PNV and Ibuprofen Condition: stable Instructions: refer to practice specific booklet Discharge to: home Follow-up Information    [name], [facility] Schedule an appointment as soon as possible for a visit in 6 week(s).   Contact information: [address] [place] [address] [phone]           Newborn Data: Live born female  Birth Weight: 7 lb 1.9 oz (3229 g) APGAR: 9, 9  Newborn Delivery   Birth date/time:  12/16/2017 14:18:00 Delivery type:  Vaginal, Spontaneous     Home with mother.  Linda Holden 12/18/2017, 9:29 AM

## 2017-12-18 NOTE — Lactation Note (Signed)
This note was copied from a baby's chart. Lactation Consultation Note  Patient Name: Linda Holden Today's Date: 12/18/2017  Mom states baby will latch initially but quickly becomes frustrated at breast.  Mom is pumping every 2-3 hours but not obtaining colostrum.  Baby is 59 hours old and at a 6% weight loss.  Discussed transitioning to a slow flow nipple for discharge.  Baby recently ate and he is sleeping in crib.  My number left to call for next feeding.  Mom has a Spectra pump at home and knows to continue pumping.  Outpatient lactation services and support reviewed and encouraged prn.   Maternal Data    Feeding Feeding Type: Formula Length of feed: 5 min  LATCH Score                   Interventions    Lactation Tools Discussed/Used     Consult Status      Linda Holden 12/18/2017, 9:23 AM

## 2017-12-18 NOTE — Addendum Note (Signed)
Addendum  created 12/18/17 0847 by Armanda Heritage, CRNA   Delete clinical note, Sign clinical note

## 2017-12-18 NOTE — Lactation Note (Signed)
This note was copied from a baby's chart. Lactation Consultation Note  Patient Name: Linda Holden [id number]'Z Date: 12/18/2017 Reason for consult: Follow-up assessment Baby was circumcised this morning and sleepy now.  Last feeding was over 3 hours ago so undressed baby and placed skin to skin in football hold.  SNS taped to breast and explained to parents.  Baby latched easily and took 10 mls before falling asleep.  Feeding was completed with bottle and slow flow nipple.  Parents like the SNS.  Instructed on cleaning.  Lactation appointment encouraged next week prn.  Maternal Data    Feeding Feeding Type: Formula Length of feed: 10 min  LATCH Score Latch: Grasps breast easily, tongue down, lips flanged, rhythmical sucking.  Audible Swallowing: A few with stimulation  Type of Nipple: Everted at rest and after stimulation  Comfort (Breast/Nipple): Soft / non-tender  Hold (Positioning): Assistance needed to correctly position infant at breast and maintain latch.  LATCH Score: 8  Interventions Interventions: Assisted with latch;Breast compression;Skin to skin;Adjust position;Breast massage;Support pillows  Lactation Tools Discussed/Used Tools: Supplemental Nutrition System   Consult Status Consult Status: Complete Follow-up type: Call as needed    Huston Foley 12/18/2017, 12:15 PM

## 2017-12-20 LAB — TYPE AND SCREEN
ABO/RH(D): A NEG
ANTIBODY SCREEN: POSITIVE
UNIT DIVISION: 0
Unit division: 0

## 2017-12-20 LAB — BPAM RBC
BLOOD PRODUCT EXPIRATION DATE: 201905282359
Blood Product Expiration Date: 201905282359
Unit Type and Rh: 9500
Unit Type and Rh: 9500

## 2017-12-24 DIAGNOSIS — N390 Urinary tract infection, site not specified: Secondary | ICD-10-CM | POA: Diagnosis not present

## 2017-12-27 ENCOUNTER — Inpatient Hospital Stay (HOSPITAL_COMMUNITY): Admission: RE | Admit: 2017-12-27 | Payer: 59 | Source: Ambulatory Visit

## 2017-12-27 ENCOUNTER — Inpatient Hospital Stay (HOSPITAL_COMMUNITY): Payer: 59

## 2018-01-26 DIAGNOSIS — R319 Hematuria, unspecified: Secondary | ICD-10-CM | POA: Diagnosis not present

## 2018-05-26 DIAGNOSIS — Z23 Encounter for immunization: Secondary | ICD-10-CM | POA: Diagnosis not present

## 2018-05-28 IMAGING — RF DG HYSTEROGRAM
4 series · 4 of 4 positions shown · non-contrast
Comparison: None.

CLINICAL DATA: Fertility testing.

EXAM:
HYSTEROSALPINGOGRAM
TECHNIQUE: Hysterosalpingogram was performed by the ordering physician under
fluoroscopy. Fluoroscopic images were submitted for radiologic
interpretation following the procedure. Please see the procedural
report for the amount of contrast and the fluoroscopy time utilized.

[Series 1: run · 1 of 1 slices shown (1 of 4)]
[im 1/1]
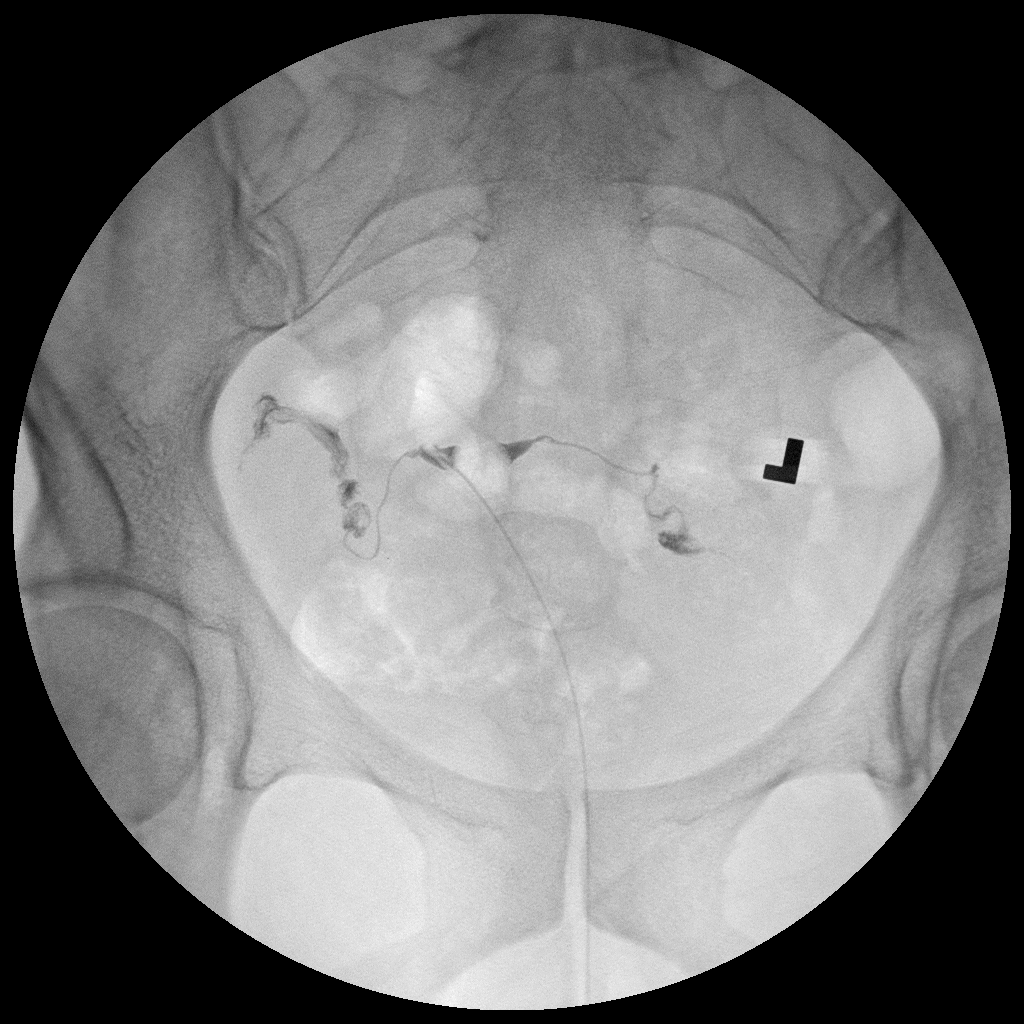

[Series 2: run · 1 of 1 slices shown (2 of 4)]
[im 1/1]
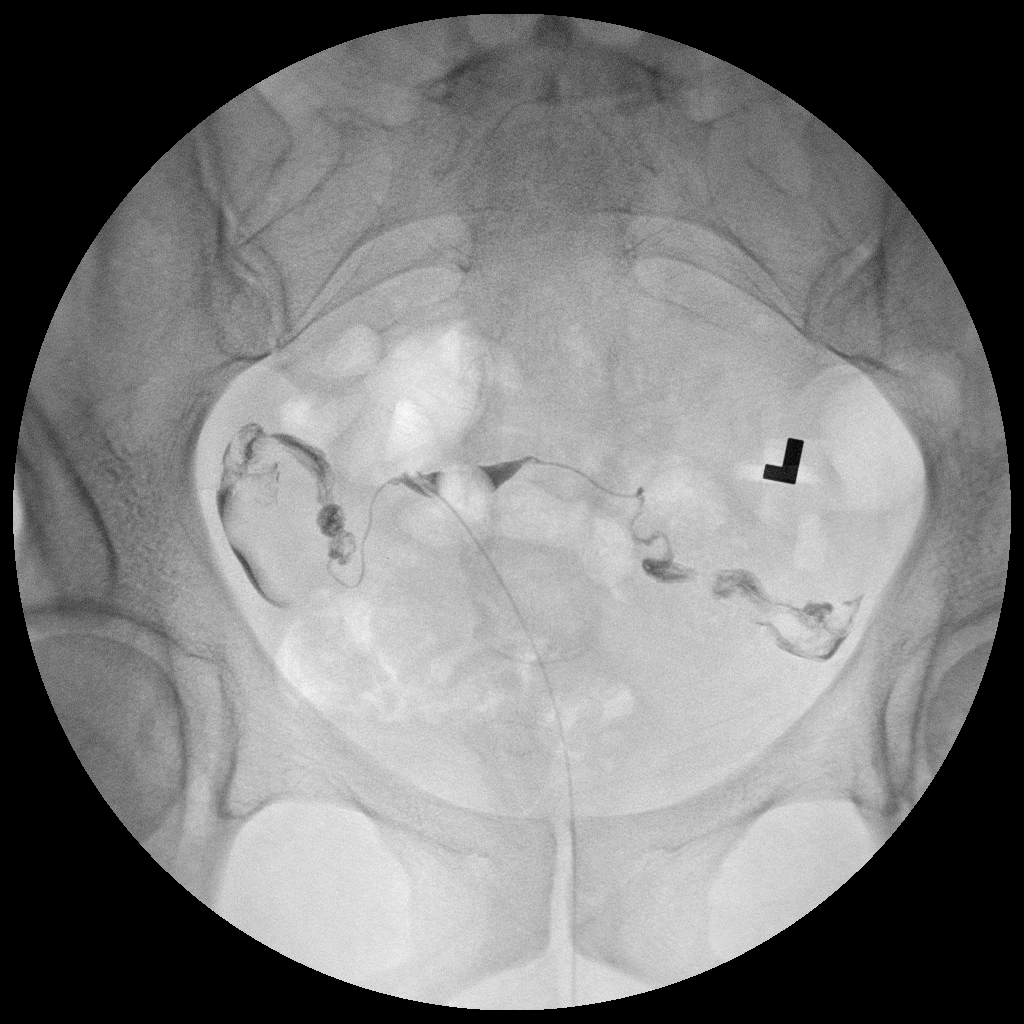

[Series 3: run · 1 of 1 slices shown (3 of 4)]
[im 1/1]
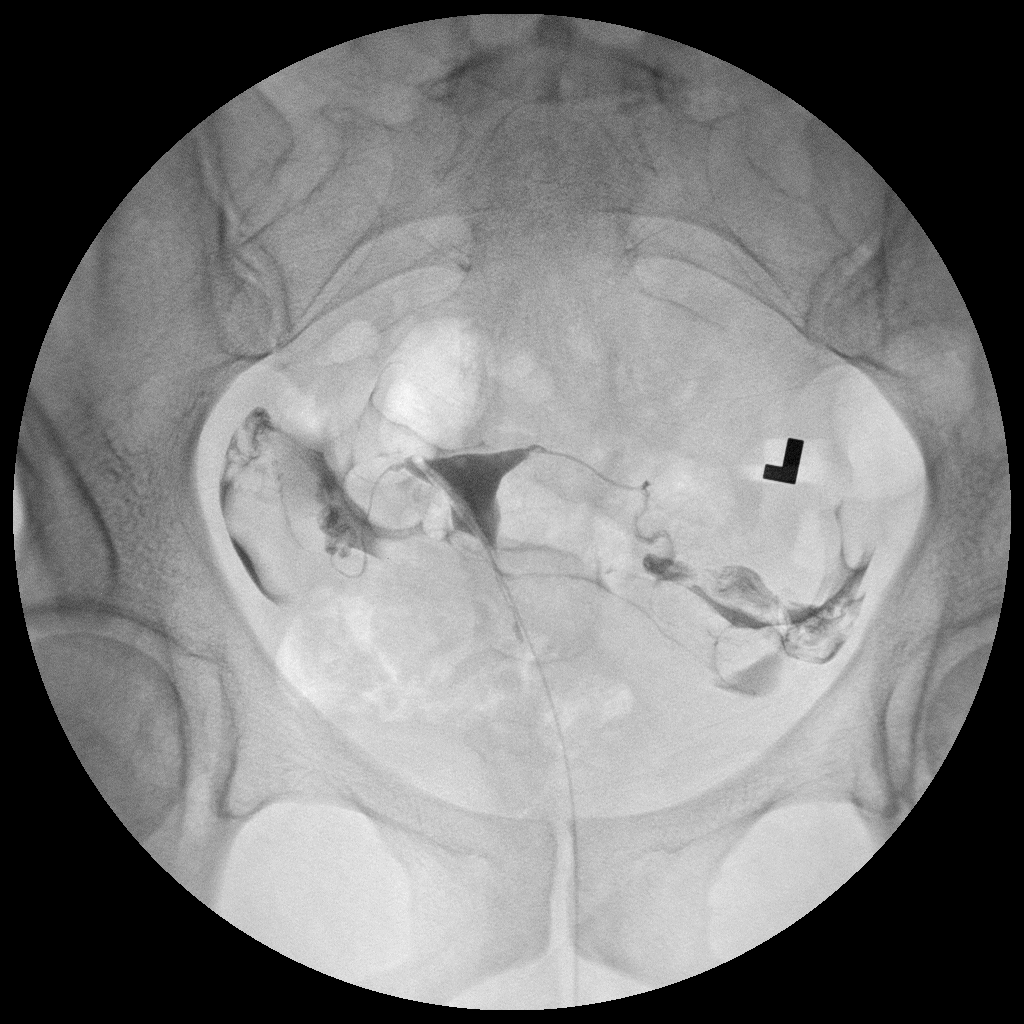

[Series 4: run · 1 of 1 slices shown (4 of 4)]
[im 1/1]
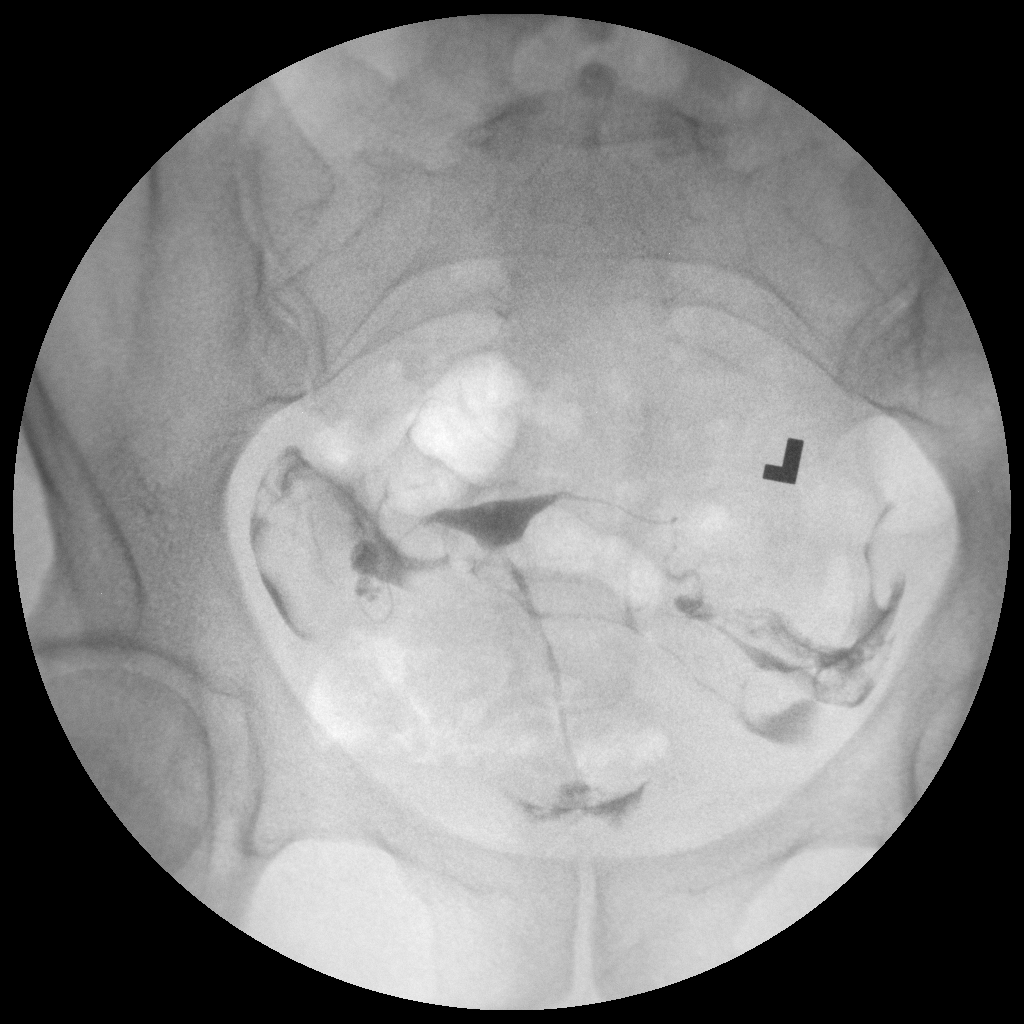

[4 of 4 positions shown; findings below may reference images not displayed]

FINDINGS: Uterine cavity appears normal in size, shape and contour, with no
filling defects.

There is bilateral prompt opacification of the normal caliber
fallopian tubes in their entirety, with normal spillage of contrast
from the fimbriated ends of both fallopian tubes into the peritoneal
cavity bilaterally and normal dispersal of contrast within the
peritoneal cavity bilaterally.
IMPRESSION: 1. Patent normal fallopian tubes bilaterally.
2. Normal uterine cavity.

## 2018-10-26 ENCOUNTER — Ambulatory Visit: Payer: Self-pay | Admitting: Family Medicine

## 2018-10-26 NOTE — Telephone Encounter (Signed)
Pt reports diarrhea  X 5 days. States 2-3 episodes daily. Reports nausea but much improved last 2 days, no vomiting. States afebrile, no abdominal pain or distention but "Stomach rumbling all the time." Reports decreased appetite but is staying hydrated. Also reports "A little lightheaded initially." States over all feeling better but concerned lasting so long.Has not taken any OTC medications. Also reports new birth control which has lengthened her cycle. Offered appt, declined. Pt states will "Give it a few days" and CB if needed. Home care advise given, pt verbalizes understanding.   Reason for Disposition . [1] MODERATE diarrhea (e.g., 4-6 times / day more than normal) AND [2] present > 48 hours (2 days) . MILD-MODERATE diarrhea (e.g., 1-6 times / day more than normal)  Answer Assessment - Initial Assessment Questions 1. DIARRHEA SEVERITY: "How bad is the diarrhea?" "How many extra stools have you had in the past 24 hours than normal?"    - NO DIARRHEA (SCALE 0)   - MILD (SCALE 1-3): Few loose or mushy BMs; increase of 1-3 stools over normal daily number of stools; mild increase in ostomy output.   -  MODERATE (SCALE 4-7): Increase of 4-6 stools daily over normal; moderate increase in ostomy output. * SEVERE (SCALE 8-10; OR 'WORST POSSIBLE'): Increase of 7 or more stools daily over normal; moderate increase in ostomy output; incontinence.     mild 2. ONSET: "When did the diarrhea begin?"       5 days ago 3. BM CONSISTENCY: "How loose or watery is the diarrhea?"      Mostly watery, a few loose 4. VOMITING: "Are you also vomiting?" If so, ask: "How many times in the past 24 hours?"      Nausea, no vomiting, first 2 days.  5. ABDOMINAL PAIN: "Are you having any abdominal pain?" If yes: "What does it feel like?" (e.g., crampy, dull, intermittent, constant)      no 6. ABDOMINAL PAIN SEVERITY: If present, ask: "How bad is the pain?"  (e.g., Scale 1-10; mild, moderate, or severe)   - MILD (1-3):  doesn't interfere with normal activities, abdomen soft and not tender to touch    - MODERATE (4-7): interferes with normal activities or awakens from sleep, tender to touch    - SEVERE (8-10): excruciating pain, doubled over, unable to do any normal activities       n/a 7. ORAL INTAKE: If vomiting, "Have you been able to drink liquids?" "How much fluids have you had in the past 24 hours?"     No vomiting 8. HYDRATION: "Any signs of dehydration?" (e.g., dry mouth [not just dry lips], too weak to stand, dizziness, new weight loss) "When did you last urinate?"     Lightheaded at times earlier 9. EXPOSURE: "Have you traveled to a foreign country recently?" "Have you been exposed to anyone with diarrhea?" "Could you have eaten any food that was spoiled?"     no 10. ANTIBIOTIC USE: "Are you taking antibiotics now or have you taken antibiotics in the past 2 months?"       no 11. OTHER SYMPTOMS: "Do you have any other symptoms?" (e.g., fever, blood in stool)      No fever, stomach "Rumbling all the time." 12. PREGNANCY: "Is there any chance you are pregnant?" "When was your last menstrual period?"      Last Friday, extended cycle  Protocols used: [facility]

## 2019-06-01 LAB — OB RESULTS CONSOLE HEPATITIS B SURFACE ANTIGEN: Hepatitis B Surface Ag: NEGATIVE

## 2019-06-01 LAB — OB RESULTS CONSOLE ABO/RH: RH Type: NEGATIVE

## 2019-06-01 LAB — OB RESULTS CONSOLE RUBELLA ANTIBODY, IGM: Rubella: IMMUNE

## 2019-06-01 LAB — OB RESULTS CONSOLE RPR: RPR: NONREACTIVE

## 2019-06-01 LAB — OB RESULTS CONSOLE GC/CHLAMYDIA
Chlamydia: NEGATIVE
Gonorrhea: NEGATIVE

## 2019-06-01 LAB — OB RESULTS CONSOLE HIV ANTIBODY (ROUTINE TESTING): HIV: NONREACTIVE

## 2019-06-01 LAB — OB RESULTS CONSOLE ANTIBODY SCREEN: Antibody Screen: NEGATIVE

## 2019-12-11 LAB — OB RESULTS CONSOLE GBS: GBS: NEGATIVE

## 2020-01-05 ENCOUNTER — Telehealth (HOSPITAL_COMMUNITY): Payer: Self-pay | Admitting: *Deleted

## 2020-01-05 ENCOUNTER — Encounter (HOSPITAL_COMMUNITY): Payer: Self-pay | Admitting: *Deleted

## 2020-01-05 NOTE — Telephone Encounter (Signed)
Preadmission screen  

## 2020-01-06 ENCOUNTER — Other Ambulatory Visit (HOSPITAL_COMMUNITY)
Admission: RE | Admit: 2020-01-06 | Discharge: 2020-01-06 | Disposition: A | Discharge status: Home / Self Care | Payer: 59 | Source: Ambulatory Visit | Attending: Obstetrics and Gynecology | Admitting: Obstetrics and Gynecology

## 2020-01-06 DIAGNOSIS — Z20822 Contact with and (suspected) exposure to covid-19: Secondary | ICD-10-CM | POA: Insufficient documentation

## 2020-01-06 DIAGNOSIS — Z01812 Encounter for preprocedural laboratory examination: Secondary | ICD-10-CM | POA: Insufficient documentation

## 2020-01-06 LAB — SARS CORONAVIRUS 2 (TAT 6-24 HRS): SARS Coronavirus 2: NEGATIVE

## 2020-01-07 ENCOUNTER — Encounter (HOSPITAL_COMMUNITY): Payer: Self-pay | Admitting: Obstetrics and Gynecology

## 2020-01-07 ENCOUNTER — Inpatient Hospital Stay (HOSPITAL_COMMUNITY): Payer: 59 | Admitting: Anesthesiology

## 2020-01-07 ENCOUNTER — Other Ambulatory Visit: Payer: Self-pay

## 2020-01-07 ENCOUNTER — Inpatient Hospital Stay (HOSPITAL_COMMUNITY)
Admission: AD | Admit: 2020-01-07 | Discharge: 2020-01-09 | DRG: 807 | Disposition: A | Discharge status: Home / Self Care | Payer: 59 | Attending: Obstetrics and Gynecology | Admitting: Obstetrics and Gynecology

## 2020-01-07 DIAGNOSIS — O479 False labor, unspecified: Secondary | ICD-10-CM | POA: Diagnosis present

## 2020-01-07 DIAGNOSIS — Z20822 Contact with and (suspected) exposure to covid-19: Secondary | ICD-10-CM | POA: Diagnosis present

## 2020-01-07 DIAGNOSIS — Z3A39 39 weeks gestation of pregnancy: Secondary | ICD-10-CM

## 2020-01-07 HISTORY — DX: Mental disorder, not otherwise specified: F99

## 2020-01-07 LAB — CBC
HCT: 34.7 % — ABNORMAL LOW (ref 36.0–46.0)
Hemoglobin: 11.2 g/dL — ABNORMAL LOW (ref 12.0–15.0)
MCH: 29.6 pg (ref 26.0–34.0)
MCHC: 32.3 g/dL (ref 30.0–36.0)
MCV: 91.8 fL (ref 80.0–100.0)
Platelets: 245 10*3/uL (ref 150–400)
RBC: 3.78 MIL/uL — ABNORMAL LOW (ref 3.87–5.11)
RDW: 14.3 % (ref 11.5–15.5)
WBC: 14.4 10*3/uL — ABNORMAL HIGH (ref 4.0–10.5)
nRBC: 0 % (ref 0.0–0.2)

## 2020-01-07 LAB — TYPE AND SCREEN
ABO/RH(D): A NEG
Antibody Screen: POSITIVE

## 2020-01-07 MED ORDER — OXYTOCIN 40 UNITS IN NORMAL SALINE INFUSION - SIMPLE MED
2.5000 [IU]/h | INTRAVENOUS | Status: DC
  Administered 2020-01-07: 2.5 [IU]/h via INTRAVENOUS
  Filled 2020-01-07: qty 1000

## 2020-01-07 MED ORDER — DIPHENHYDRAMINE HCL 50 MG/ML IJ SOLN: 12.5000 mg | INTRAMUSCULAR | Status: DC | PRN

## 2020-01-07 MED ORDER — LACTATED RINGERS IV SOLN
500.0000 mL | Freq: Once | INTRAVENOUS | Status: AC
  Administered 2020-01-07: 500 mL via INTRAVENOUS

## 2020-01-07 MED ORDER — FLEET ENEMA 7-19 GM/118ML RE ENEM: 1.0000 | ENEMA | RECTAL | Status: DC | PRN

## 2020-01-07 MED ORDER — ACETAMINOPHEN 325 MG PO TABS: 650.0000 mg | ORAL_TABLET | ORAL | Status: DC | PRN

## 2020-01-07 MED ORDER — OXYTOCIN BOLUS FROM INFUSION
500.0000 mL | Freq: Once | INTRAVENOUS | Status: AC
  Administered 2020-01-07: 500 mL via INTRAVENOUS

## 2020-01-07 MED ORDER — FENTANYL-BUPIVACAINE-NACL 0.5-0.125-0.9 MG/250ML-% EP SOLN
12.0000 mL/h | EPIDURAL | Status: DC | PRN
  Filled 2020-01-07: qty 250

## 2020-01-07 MED ORDER — PHENYLEPHRINE 40 MCG/ML (10ML) SYRINGE FOR IV PUSH (FOR BLOOD PRESSURE SUPPORT): 80.0000 ug | PREFILLED_SYRINGE | INTRAVENOUS | Status: DC | PRN

## 2020-01-07 MED ORDER — EPHEDRINE 5 MG/ML INJ: 10.0000 mg | INTRAVENOUS | Status: DC | PRN

## 2020-01-07 MED ORDER — LIDOCAINE HCL (PF) 1 % IJ SOLN
INTRAMUSCULAR | Status: DC | PRN
  Administered 2020-01-07: 4 mL via EPIDURAL
  Administered 2020-01-07: 5 mL via EPIDURAL

## 2020-01-07 MED ORDER — FENTANYL CITRATE (PF) 100 MCG/2ML IJ SOLN: 50.0000 ug | INTRAMUSCULAR | Status: DC | PRN

## 2020-01-07 MED ORDER — AMMONIA AROMATIC IN INHA
RESPIRATORY_TRACT | Status: AC
  Filled 2020-01-07: qty 10

## 2020-01-07 MED ORDER — SOD CITRATE-CITRIC ACID 500-334 MG/5ML PO SOLN: 30.0000 mL | ORAL | Status: DC | PRN

## 2020-01-07 MED ORDER — OXYCODONE-ACETAMINOPHEN 5-325 MG PO TABS: 2.0000 | ORAL_TABLET | ORAL | Status: DC | PRN

## 2020-01-07 MED ORDER — LACTATED RINGERS IV SOLN: INTRAVENOUS | Status: DC

## 2020-01-07 MED ORDER — ONDANSETRON HCL 4 MG/2ML IJ SOLN: 4.0000 mg | Freq: Four times a day (QID) | INTRAMUSCULAR | Status: DC | PRN

## 2020-01-07 MED ORDER — LIDOCAINE HCL (PF) 1 % IJ SOLN: 30.0000 mL | INTRAMUSCULAR | Status: DC | PRN

## 2020-01-07 MED ORDER — SODIUM CHLORIDE (PF) 0.9 % IJ SOLN
INTRAMUSCULAR | Status: DC | PRN
  Administered 2020-01-07: 12 mL/h via EPIDURAL

## 2020-01-07 MED ORDER — LACTATED RINGERS IV SOLN: 500.0000 mL | INTRAVENOUS | Status: DC | PRN

## 2020-01-07 MED ORDER — LACTATED RINGERS IV SOLN: 500.0000 mL | Freq: Once | INTRAVENOUS | Status: DC

## 2020-01-07 MED ORDER — OXYCODONE-ACETAMINOPHEN 5-325 MG PO TABS: 1.0000 | ORAL_TABLET | ORAL | Status: DC | PRN

## 2020-01-07 NOTE — Progress Notes (Signed)
Delivery Note At 10:43 PM a viable female was delivered via Vaginal, Spontaneous (Presentation: Right Occiput Anterior).  APGAR: 8, 9; weight  .   Placenta status: Spontaneous;Pathology, Intact.  Cord: 3 vessels with the following complications: None.  Cord pH:   Anesthesia: Epidural Episiotomy: None Lacerations: None Suture Repair: N/A Est. Blood Loss (mL): 100  Mom to postpartum.  Baby to Couplet care / Skin to Skin.  Linda Holden II 01/07/2020, 10:51 PM

## 2020-01-07 NOTE — Anesthesia Preprocedure Evaluation (Addendum)
Anesthesia Evaluation  Patient identified by MRN, date of birth, ID band Patient awake    Reviewed: Allergy & Precautions, Patient's Chart, lab work & pertinent test results  History of Anesthesia Complications (+) PONV and history of anesthetic complications  Airway Mallampati: II  TM Distance: >3 FB Neck ROM: Full    Dental no notable dental hx. (+) Teeth Intact   Pulmonary neg pulmonary ROS,    Pulmonary exam normal breath sounds clear to auscultation       Cardiovascular negative cardio ROS Normal cardiovascular exam Rhythm:Regular Rate:Normal     Neuro/Psych PSYCHIATRIC DISORDERS negative neurological ROS     GI/Hepatic Neg liver ROS, GERD  ,  Endo/Other  negative endocrine ROS  Renal/GU negative Renal ROS  negative genitourinary   Musculoskeletal negative musculoskeletal ROS (+)   Abdominal   Peds  Hematology  (+) anemia ,   Anesthesia Other Findings   Reproductive/Obstetrics (+) Pregnancy                             Anesthesia Physical Anesthesia Plan  ASA: II  Anesthesia Plan: Epidural   Post-op Pain Management:    Induction:   PONV Risk Score and Plan:   Airway Management Planned: Natural Airway  Additional Equipment:   Intra-op Plan:   Post-operative Plan:   Informed Consent: I have reviewed the patients History and Physical, chart, labs and discussed the procedure including the risks, benefits and alternatives for the proposed anesthesia with the patient or authorized representative who has indicated his/her understanding and acceptance.       Plan Discussed with: Anesthesiologist  Anesthesia Plan Comments:        Anesthesia Quick Evaluation

## 2020-01-07 NOTE — Anesthesia Procedure Notes (Signed)
Epidural Patient location during procedure: OB Start time: 01/07/2020 7:34 PM End time: 01/07/2020 7:42 PM  Staffing Anesthesiologist: Mal Amabile, MD Performed: anesthesiologist   Preanesthetic Checklist Completed: patient identified, IV checked, site marked, risks and benefits discussed, surgical consent, monitors and equipment checked, pre-op evaluation and timeout performed  Epidural Patient position: sitting Prep: DuraPrep and site prepped and draped Patient monitoring: continuous pulse ox and blood pressure Approach: midline Location: L3-L4 Injection technique: LOR air  Needle:  Needle type: Tuohy  Needle gauge: 17 G Needle length: 9 cm and 9 Needle insertion depth: 5 cm cm Catheter type: closed end flexible Catheter size: 19 Gauge Catheter at skin depth: 10 cm Test dose: negative and Other  Assessment Events: blood not aspirated, injection not painful, no injection resistance, no paresthesia and negative IV test  Additional Notes Patient identified. Risks and benefits discussed including failed block, incomplete  Pain control, post dural puncture headache, nerve damage, paralysis, blood pressure Changes, nausea, vomiting, reactions to medications-both toxic and allergic and post Partum back pain. All questions were answered. Patient expressed understanding and wished to proceed. Sterile technique was used throughout procedure. Epidural site was Dressed with sterile barrier dressing. No paresthesias, signs of intravascular injection Or signs of intrathecal spread were encountered.  Patient was more comfortable after the epidural was dosed. Please see RN's note for documentation of vital signs and FHR which are stable. Reason for block:procedure for pain

## 2020-01-07 NOTE — MAU Note (Signed)
Patient presents to MAU with c/o contractions every 2-3 minutes since 1050, patient denies LOF, denies vaginal bleeding, patient feels fetal movement.

## 2020-01-07 NOTE — H&P (Signed)
Linda Holden is a 34 y.o. female presenting for UCs. Pregnancy uncomplicated. OB History    Gravida  2   Para  1   Term  1   Preterm      AB      Living  1     SAB      TAB      Ectopic      Multiple  0   Live Births  1          Past Medical History:  Diagnosis Date  . Mental disorder   . PONV (postoperative nausea and vomiting)    Past Surgical History:  Procedure Laterality Date  . TONSILLECTOMY    . WISDOM TOOTH EXTRACTION     Family History: family history includes Breast cancer in her paternal grandmother; Depression in her mother; Diabetes in her maternal uncle; Fibromyalgia in her mother; Hypertension in her father and maternal grandfather. Social History:  reports that she has never smoked. She has never used smokeless tobacco. She reports previous alcohol use. She reports that she does not use drugs.     Maternal Diabetes: No Genetic Screening: Normal Maternal Ultrasounds/Referrals: Normal Fetal Ultrasounds or other Referrals:  None Maternal Substance Abuse:  No Significant Maternal Medications:  None Significant Maternal Lab Results:  Group B Strep negative Other Comments:  None  Review of Systems  Eyes: Negative for visual disturbance.  Gastrointestinal: Negative for abdominal pain.  Neurological: Negative for headaches.   Maternal Medical History:  Reason for admission: Contractions.   Contractions: Onset was 3-5 hours ago.    Fetal activity: Perceived fetal activity is normal.      Dilation: 4 Effacement (%): 90 Station: -3 Exam by:: Arrick Dutton, MD Blood pressure 125/70, pulse 71, temperature 98.3 F (36.8 C), temperature source Oral, resp. rate 18, height 5\' 6"  (1.676 m), weight 74.5 kg, SpO2 100 %, unknown if currently breastfeeding. Maternal Exam:  Abdomen: Fetal presentation: vertex     Fetal Exam Fetal State Assessment: Category I - tracings are normal.     Physical Exam  Cardiovascular: Normal rate.  Respiratory:  Effort normal.  GI: Soft.    4-5/90/-3/vtx AROM>thin mec Epidural in  Prenatal labs: ABO, Rh: --/--/PENDING (05/16 1841) Antibody: PENDING (05/16 1841) Rubella: Immune (10/08 0000) RPR: Nonreactive (10/08 0000)  HBsAg: Negative (10/08 0000)  HIV: Non-reactive (10/08 0000)  GBS: Negative/-- (04/19 0000)   Assessment/Plan: 34 yo G2P1 anticipate vaginal delivery   38 II 01/07/2020, 8:00 PM

## 2020-01-08 ENCOUNTER — Encounter (HOSPITAL_COMMUNITY): Payer: Self-pay | Admitting: Obstetrics and Gynecology

## 2020-01-08 LAB — CBC
HCT: 31.4 % — ABNORMAL LOW (ref 36.0–46.0)
Hemoglobin: 10.2 g/dL — ABNORMAL LOW (ref 12.0–15.0)
MCH: 30.2 pg (ref 26.0–34.0)
MCHC: 32.5 g/dL (ref 30.0–36.0)
MCV: 92.9 fL (ref 80.0–100.0)
Platelets: 206 10*3/uL (ref 150–400)
RBC: 3.38 MIL/uL — ABNORMAL LOW (ref 3.87–5.11)
RDW: 14.2 % (ref 11.5–15.5)
WBC: 14.7 10*3/uL — ABNORMAL HIGH (ref 4.0–10.5)
nRBC: 0 % (ref 0.0–0.2)

## 2020-01-08 LAB — RPR: RPR Ser Ql: NONREACTIVE

## 2020-01-08 MED ORDER — ACETAMINOPHEN 325 MG PO TABS
650.0000 mg | ORAL_TABLET | ORAL | Status: DC | PRN
  Administered 2020-01-08: 650 mg via ORAL
  Filled 2020-01-08 (×2): qty 2

## 2020-01-08 MED ORDER — DIBUCAINE (PERIANAL) 1 % EX OINT
1.0000 "application " | TOPICAL_OINTMENT | CUTANEOUS | Status: DC | PRN
  Filled 2020-01-08: qty 28

## 2020-01-08 MED ORDER — COCONUT OIL OIL
1.0000 "application " | TOPICAL_OIL | Status: DC | PRN
  Administered 2020-01-08: 1 via TOPICAL

## 2020-01-08 MED ORDER — WITCH HAZEL-GLYCERIN EX PADS
1.0000 "application " | MEDICATED_PAD | CUTANEOUS | Status: DC | PRN
  Administered 2020-01-08: 1 via TOPICAL

## 2020-01-08 MED ORDER — SIMETHICONE 80 MG PO CHEW: 80.0000 mg | CHEWABLE_TABLET | ORAL | Status: DC | PRN

## 2020-01-08 MED ORDER — ONDANSETRON HCL 4 MG PO TABS: 4.0000 mg | ORAL_TABLET | ORAL | Status: DC | PRN

## 2020-01-08 MED ORDER — PRENATAL MULTIVITAMIN CH
1.0000 | ORAL_TABLET | Freq: Every day | ORAL | Status: DC
  Administered 2020-01-08: 1 via ORAL
  Filled 2020-01-08: qty 1

## 2020-01-08 MED ORDER — DIPHENHYDRAMINE HCL 25 MG PO CAPS: 25.0000 mg | ORAL_CAPSULE | Freq: Four times a day (QID) | ORAL | Status: DC | PRN

## 2020-01-08 MED ORDER — TETANUS-DIPHTH-ACELL PERTUSSIS 5-2.5-18.5 LF-MCG/0.5 IM SUSP: 0.5000 mL | Freq: Once | INTRAMUSCULAR | Status: DC

## 2020-01-08 MED ORDER — IBUPROFEN 600 MG PO TABS
600.0000 mg | ORAL_TABLET | Freq: Four times a day (QID) | ORAL | Status: DC
  Administered 2020-01-08 – 2020-01-09 (×6): 600 mg via ORAL
  Filled 2020-01-08 (×6): qty 1

## 2020-01-08 MED ORDER — SENNOSIDES-DOCUSATE SODIUM 8.6-50 MG PO TABS
2.0000 | ORAL_TABLET | ORAL | Status: DC
  Administered 2020-01-08 (×2): 2 via ORAL
  Filled 2020-01-08 (×2): qty 2

## 2020-01-08 MED ORDER — BENZOCAINE-MENTHOL 20-0.5 % EX AERO: 1.0000 "application " | INHALATION_SPRAY | CUTANEOUS | Status: DC | PRN

## 2020-01-08 MED ORDER — OXYCODONE HCL 5 MG PO TABS: 5.0000 mg | ORAL_TABLET | ORAL | Status: DC | PRN

## 2020-01-08 MED ORDER — RHO D IMMUNE GLOBULIN 1500 UNIT/2ML IJ SOSY
300.0000 ug | PREFILLED_SYRINGE | Freq: Once | INTRAMUSCULAR | Status: AC
  Administered 2020-01-08: 300 ug via INTRAVENOUS
  Filled 2020-01-08: qty 2

## 2020-01-08 MED ORDER — ONDANSETRON HCL 4 MG/2ML IJ SOLN: 4.0000 mg | INTRAMUSCULAR | Status: DC | PRN

## 2020-01-08 MED ORDER — OXYCODONE HCL 5 MG PO TABS: 10.0000 mg | ORAL_TABLET | ORAL | Status: DC | PRN

## 2020-01-08 MED ORDER — ZOLPIDEM TARTRATE 5 MG PO TABS: 5.0000 mg | ORAL_TABLET | Freq: Every evening | ORAL | Status: DC | PRN

## 2020-01-08 NOTE — Lactation Note (Signed)
This note was copied from a baby's chart. Lactation Consultation Note  Patient Name: Linda Holden Date: 01/08/2020    Initial visit at 11 hours of life. Mom is a P2 who nursed her 1st child for 10 months.   During consult, we discussed Mom's concerns related to her 1st baby. That baby was early-term, born at 91 weeks & did not nurse well. It took 4-5 days for her milk to come to volume.Of the utmost importance is that if feeding at the breast isn't going well or if baby isn't transferring enough milk during the colostral phase, she wants to supplement. She wants to ensure that baby is being fed. Mom is aware that we now offer PDBM.   Parents agree that this infant (born term) is already doing "leaps and bounds better" than his older sibling. During the consult, I assisted with latch. Infant would swallow a few times & then fall asleep, which I reassured parents was normal for his 1st day out of the womb. "[name]" has also had some longer feedings. I anticipate that she will experience lactogenesis II earlier with this infant.   With her 1st child, once Mom's milk came to volume, "it came in with a vengeance." There was a time that Mom could pump 8-10 oz after feedings. An RN had set Mom up with a pump early this morning. B/c of her abundant supply with her 1st child, she knows not to overdo it with pumping. Parents have bought a small deep freezer for milk storage.   While Mom received her Rhogam shot, I made Dad aware of O/P services, breastfeeding support groups, community resources, and our phone # for post-discharge questions.   [name] [facility] 01/08/2020, 10:42 AM

## 2020-01-08 NOTE — Lactation Note (Addendum)
This note was copied from a baby's chart. Lactation Consultation Note  Patient Name: Linda Holden Date: 01/08/2020    Mom is concerned that infant isn't drinking as much & so is asking to supplement. Mom was given the option of DBM versus formula. She chose DBM. Mom & Dad would like to do a bottle.   Infant drank 10 mL easily with a slow flow nipple. I showed Mom how to pace feed so that infant wouldn't drink too quickly. Mom was able to pace feed successfully. Soon after eating, [name] spat up a small amount.   Parents asked about my observations of his feeding. I noted that when [name] extends his tongue, the end of his tongue becomes heart-shaped, which I shared with parents.  Mom knows the ideal is to pump whenever infant receives supplement that is not her own EBM. Mom recently pumped.   [name] [facility] 01/08/2020, 2:13 PM

## 2020-01-08 NOTE — Progress Notes (Signed)
Post Partum Day 1 Subjective: no complaints, up ad lib, voiding and tolerating PO  Objective: Blood pressure 103/72, pulse 75, temperature 97.9 F (36.6 C), temperature source Oral, resp. rate 20, height 5\' 6"  (1.676 m), weight 74.5 kg, SpO2 98 %, unknown if currently breastfeeding.  Physical Exam:  General: alert Lochia: appropriate Uterine Fundus: firm Incision: n/a DVT Evaluation: No evidence of DVT seen on physical exam.  Recent Labs    01/07/20 1834 01/08/20 0514  HGB 11.2* 10.2*  HCT 34.7* 31.4*    Assessment/Plan: Plan for discharge tomorrow, Breastfeeding and Circumcision prior to discharge Baby not yet evaluated by peds and not ready for circ yet.    LOS: 1 day   01/10/20 01/08/2020, 9:16 AM

## 2020-01-08 NOTE — Anesthesia Postprocedure Evaluation (Signed)
Anesthesia Post Note  Patient: Linda Holden  Procedure(s) Performed: AN AD HOC LABOR EPIDURAL     Patient location during evaluation: Mother Baby Anesthesia Type: Epidural Level of consciousness: awake Pain management: satisfactory to patient Vital Signs Assessment: post-procedure vital signs reviewed and stable Respiratory status: spontaneous breathing Cardiovascular status: stable Anesthetic complications: no   Report via RN Last Vitals:  Vitals:   01/08/20 0130 01/08/20 0515  BP: 109/65 103/72  Pulse: 75 75  Resp: 18 20  Temp: 36.6 C 36.6 C  SpO2: 99% 98%    Last Pain:  Vitals:   01/08/20 0735  TempSrc:   PainSc: 0-No pain   Pain Goal: Patients Stated Pain Goal: 0 (01/07/20 1502)              Epidural/Spinal Function Cutaneous sensation: Normal sensation (01/08/20 0735), Patient able to flex knees: Yes (01/08/20 0735), Patient able to lift hips off bed: Yes (01/08/20 0735), Back pain beyond tenderness at insertion site: No (01/08/20 0735), Progressively worsening motor and/or sensory loss: No (01/08/20 0735), Bowel and/or bladder incontinence post epidural: No (01/08/20 0735)  Cephus Shelling

## 2020-01-09 ENCOUNTER — Inpatient Hospital Stay (HOSPITAL_COMMUNITY): Admission: AD | Admit: 2020-01-09 | Payer: 59 | Source: Home / Self Care | Admitting: Obstetrics and Gynecology

## 2020-01-09 ENCOUNTER — Inpatient Hospital Stay (HOSPITAL_COMMUNITY): Payer: 59

## 2020-01-09 LAB — RH IG WORKUP (INCLUDES ABO/RH)
ABO/RH(D): A NEG
Fetal Screen: NEGATIVE
Gestational Age(Wks): 39
Unit division: 0

## 2020-01-09 LAB — SURGICAL PATHOLOGY

## 2020-01-09 NOTE — Discharge Summary (Signed)
Postpartum Discharge Summary       Patient Name: Linda Holden DOB: [birth year] MRN: [id number]  Date of admission: 01/07/2020 Delivery date:01/07/2020  Delivering provider: Everlene Farrier  Date of discharge: 01/09/2020  Admitting diagnosis: Uterine contractions during pregnancy [O62.2] Intrauterine pregnancy: [redacted]w[redacted]d    Secondary diagnosis:  Active Problems:   Uterine contractions during pregnancy  Additional problems: none    Discharge diagnosis: Term Pregnancy Delivered                                              Post partum procedures: Augmentation:  Complications: None  Hospital course: Onset of Labor With Vaginal Delivery      34y.o. yo [id number] admitted in Active Labor on 01/07/2020. Patient had an uncomplicated labor course as follows:  Membrane Rupture Time/Date: 7:56 PM ,01/07/2020   Delivery Method:Vaginal, Spontaneous  Episiotomy: None  Lacerations:  None  Patient had an uncomplicated postpartum course.  She is ambulating, tolerating a regular diet, passing flatus, and urinating well. Patient is discharged home in stable condition on 01/09/20.  Newborn Data: Birth date:01/07/2020  Birth time:10:43 PM  Gender:Female  Living status:Living  Apgars:8 ,9  Weight:3459 g   Magnesium Sulfate received: No BMZ received: No Rhophylac:N/A MMR:N/A T-DaP:Given prenatally Flu: N/A Transfusion:No  Physical exam  Vitals:   01/08/20 0935 01/08/20 1400 01/08/20 2250 01/09/20 0509  BP: 104/67 112/68 107/68 101/66  Pulse: 87 84 62 62  Resp: _0 Temp: 97.8 F (36.6 C) (!) 97.4 F (36.3 C) 98.2 F (36.8 C) 98 F (36.7 C)  TempSrc: Oral  Oral Oral  SpO2:    100%  Weight:      Height:       General: alert Lochia: appropriate Uterine Fundus: firm Incision: Healing well with no significant drainage, N/A DVT Evaluation: No evidence of DVT seen on physical exam. Labs: Lab Results  Component Value Date   WBC 14.7 (H) 01/08/2020   HGB 10.2 (L)  01/08/2020   HCT 31.4 (L) 01/08/2020   MCV 92.9 01/08/2020   PLT 206 01/08/2020   CMP Latest Ref Rng & Units 08/18/2013  Total Protein 6.0 - 8.3 g/dL 6.6  Total Bilirubin 0.3 - 1.2 mg/dL 1.0  Alkaline Phos 39 - 117 U/L 32(L)  AST 0 - 37 U/L 16  ALT 0 - 35 U/L 10   Edinburgh Score: Edinburgh Postnatal Depression Scale Screening Tool 01/08/2020  I have been able to laugh and see the funny side of things. 0  I have looked forward with enjoyment to things. 0  I have blamed myself unnecessarily when things went wrong. 2  I have been anxious or worried for no good reason. 2  I have felt scared or panicky for no good reason. 0  Things have been getting on top of me. 1  I have been so unhappy that I have had difficulty sleeping. 2  I have felt sad or miserable. 1  I have been so unhappy that I have been crying. 1  The thought of harming myself has occurred to me. 0  Edinburgh Postnatal Depression Scale Total 9      After visit meds:  Allergies as of 01/09/2020      Reactions   Sulfamethoxazole-trimethoprim    Mother allergic severe, was told to not let her children have  sulfa      Medication List    TAKE these medications   ibuprofen 600 MG tablet Commonly known as: ADVIL Take 1 tablet (600 mg total) by mouth every 6 (six) hours.   multivitamin-prenatal 27-0.8 MG Tabs tablet Take 1 tablet by mouth daily at 12 noon.        Discharge home in stable condition Infant Feeding: Breast Infant Disposition:home with mother Discharge instruction: per After Visit Summary and Postpartum booklet. Activity: Advance as tolerated. Pelvic rest for 6 weeks.  Diet: routine diet Anticipated Birth Control: Unsure Postpartum Appointment:6 weeks Additional Postpartum F/U:  Future Appointments:No future appointments. Follow up Visit:      01/09/2020 Luz Lex, MD

## 2020-01-09 NOTE — Lactation Note (Signed)
This note was copied from a baby's chart. Lactation Consultation Note  Patient Name: Linda Holden QTTCN'G Date: 01/09/2020 Reason for consult: Follow-up assessment   P2, Baby 34 hours old.  Mother is breastfeeding and supplementing with donor milk. Mother has not been pumping due to frequent cluster feeding.but knows to pump when she gets home. Mother is aware of possible tongue restriction and will discuss with Peds MD. Feed on demand with cues.  Goal 8-12+ times per day after first 24 hrs.  Place baby STS if not cueing.  Reviewed engorgement care and monitoring voids/stools. Mother has 2 DEBPs at home.     Maternal Data    Feeding    LATCH Score                   Interventions Interventions: Breast feeding basics reviewed;DEBP  Lactation Tools Discussed/Used     Consult Status Consult Status: Complete Date: 01/09/20    Dahlia Byes [facility] 01/09/2020, 9:42 AM

## 2021-12-31 ENCOUNTER — Other Ambulatory Visit: Payer: Self-pay | Admitting: Nurse Practitioner

## 2021-12-31 DIAGNOSIS — N6320 Unspecified lump in the left breast, unspecified quadrant: Secondary | ICD-10-CM

## 2022-01-14 ENCOUNTER — Other Ambulatory Visit: Payer: 59
